# Patient Record
Sex: Female | Born: 1980 | Race: Black or African American | Hispanic: No | Marital: Married | Smoking: Never smoker
Health system: Southern US, Community
[De-identification: ages and names within clinical notes are randomized; demographics above are authoritative.]

## PROBLEM LIST (undated history)

## (undated) DIAGNOSIS — J45909 Unspecified asthma, uncomplicated: Secondary | ICD-10-CM

## (undated) DIAGNOSIS — R87629 Unspecified abnormal cytological findings in specimens from vagina: Secondary | ICD-10-CM

## (undated) HISTORY — DX: Unspecified abnormal cytological findings in specimens from vagina: R87.629

## (undated) HISTORY — DX: Unspecified asthma, uncomplicated: J45.909

---

## 2015-04-02 DIAGNOSIS — J4520 Mild intermittent asthma, uncomplicated: Secondary | ICD-10-CM | POA: Insufficient documentation

## 2015-06-30 DIAGNOSIS — E669 Obesity, unspecified: Secondary | ICD-10-CM | POA: Insufficient documentation

## 2015-09-10 ENCOUNTER — Other Ambulatory Visit (INDEPENDENT_AMBULATORY_CARE_PROVIDER_SITE_OTHER): Payer: BLUE CROSS/BLUE SHIELD

## 2015-09-10 ENCOUNTER — Ambulatory Visit (INDEPENDENT_AMBULATORY_CARE_PROVIDER_SITE_OTHER): Payer: BLUE CROSS/BLUE SHIELD | Admitting: Obstetrics and Gynecology

## 2015-09-10 ENCOUNTER — Other Ambulatory Visit: Payer: Self-pay | Admitting: Obstetrics and Gynecology

## 2015-09-10 VITALS — BP 114/67 | HR 76 | Ht 63.0 in | Wt 192.0 lb

## 2015-09-10 DIAGNOSIS — O09291 Supervision of pregnancy with other poor reproductive or obstetric history, first trimester: Secondary | ICD-10-CM

## 2015-09-10 DIAGNOSIS — Z1389 Encounter for screening for other disorder: Secondary | ICD-10-CM

## 2015-09-10 DIAGNOSIS — Z36 Encounter for antenatal screening of mother: Secondary | ICD-10-CM

## 2015-09-10 DIAGNOSIS — Z8759 Personal history of other complications of pregnancy, childbirth and the puerperium: Secondary | ICD-10-CM

## 2015-09-10 DIAGNOSIS — Z113 Encounter for screening for infections with a predominantly sexual mode of transmission: Secondary | ICD-10-CM

## 2015-09-10 DIAGNOSIS — O3680X0 Pregnancy with inconclusive fetal viability, not applicable or unspecified: Secondary | ICD-10-CM

## 2015-09-10 DIAGNOSIS — R638 Other symptoms and signs concerning food and fluid intake: Secondary | ICD-10-CM

## 2015-09-10 DIAGNOSIS — Z3491 Encounter for supervision of normal pregnancy, unspecified, first trimester: Secondary | ICD-10-CM

## 2015-09-10 DIAGNOSIS — Z369 Encounter for antenatal screening, unspecified: Secondary | ICD-10-CM

## 2015-09-10 DIAGNOSIS — R899 Unspecified abnormal finding in specimens from other organs, systems and tissues: Secondary | ICD-10-CM | POA: Insufficient documentation

## 2015-09-10 DIAGNOSIS — Z3490 Encounter for supervision of normal pregnancy, unspecified, unspecified trimester: Secondary | ICD-10-CM

## 2015-09-10 NOTE — Progress Notes (Signed)
Whitney Velasquez presents for NOB nurse interview visit. G-5.  [id number]. [facility]cy [facility].  Pt. Had miscarriage in 04/2015, SAB.  Has 3 children with one pregnancy being a twin.  Pregnancy education material explained and given. No cats in the home. NOB labs ordered. TSH/HbgA1c due to Increased BMI. HIV labs and Drug screen were explained optional and she could opt out of tests but did not decline. Drug screen ordered. PNV encouraged. To discuss genetic testing with provider.  Ultrasound done for viability and dating for AMA, recent miscarriage. Pt denies any vaginal bleeding (small amt brownish discharge x1) or pain. Pt. To follow up with provider in 4 weeks for NOB physical.  Pt encouraged to contact office with any problems or concerns. All questions answered.  Reviewed. Prentice DockerMartin A Nyella Eckels, MD   Labs: A+/antibody screen negative/HIV nonreactive/l on insulin/rubella immune/RPR pending Pap (05/07/2015): Negative/negative Hemoglobin A1c: 5.1 TSH is 0.831 Urine drug screen: Pending

## 2015-09-10 NOTE — Patient Instructions (Signed)
Pregnancy and Zika Virus Disease Zika virus disease, or Zika, is an illness that can spread to people from mosquitoes that carry the virus. It may also spread from person to person through infected body fluids. Zika first occurred in [place], but recently it has spread to new areas. The virus occurs in tropical climates. The location of Zika continues to change. Most people who become infected with Zika virus do not develop serious illness. However, Zika may cause birth defects in an unborn baby whose mother is infected with the virus. It may also increase the risk of miscarriage. WHAT ARE THE SYMPTOMS OF ZIKA VIRUS DISEASE? In many cases, people who have been infected with Zika virus do not develop any symptoms. If symptoms appear, they usually start about a week after the person is infected. Symptoms are usually mild. They may include:  Fever.  Rash.  Red eyes.  Joint pain. HOW DOES ZIKA VIRUS DISEASE SPREAD? The main way that Zika virus spreads is through the bite of a certain type of mosquito. Unlike most types of mosquitos, which bite only at night, the type of mosquito that carries Zika virus bites both at night and during the [place]. Zika virus can also spread through sexual contact, through a blood transfusion, and from a mother to her baby before or during birth. Once you have had Zika virus disease, it is unlikely that you will get it again. CAN I PASS ZIKA TO MY BABY DURING PREGNANCY? Yes, Zika can pass from a mother to her baby before or during birth. WHAT PROBLEMS CAN ZIKA CAUSE FOR MY BABY? A woman who is infected with Zika virus while pregnant is at risk of having her baby born with a condition in which the brain or head is smaller than expected (microcephaly). Babies who have microcephaly can have developmental delays, seizures, hearing problems, and vision problems. Having Zika virus disease during pregnancy can also increase the risk of miscarriage. HOW CAN ZIKA VIRUS DISEASE BE  PREVENTED? There is no vaccine to prevent Zika. The best way to prevent the disease is to avoid infected mosquitoes and avoid exposure to body fluids that can spread the virus. Avoid any possible exposure to Zika by taking the following precautions. For women and their sex partners:  Avoid traveling to high-risk areas. The locations where Zika is being reported change often. To identify high-risk areas, check the CDC travel website: www.cdc.gov/zika/geo/index.html  If you or your sex partner must travel to a high-risk area, talk with a health care provider before and after traveling.  Take all precautions to avoid mosquito bites if you live in, or travel to, any of the high-risk areas. Insect repellents are safe to use during pregnancy.  Ask your health care provider when it is safe to have sexual contact. For women:  If you are pregnant or trying to become pregnant, avoid sexual contact with persons who may have been exposed to Zika virus, persons who have possible symptoms of Zika, or persons whose history you are unsure about. If you choose to have sexual contact with someone who may have been exposed to Zika virus, use condoms correctly during the entire duration of sexual activity, every time. Do not share sexual devices, as you may be exposed to body fluids.  Ask your health care provider about when it is safe to attempt pregnancy after a possible exposure to Zika virus. WHAT STEPS SHOULD I TAKE TO AVOID MOSQUITO BITES? Take these steps to avoid mosquito bites when you are   in a high-risk area:  Wear loose clothing that covers your arms and legs.  Limit your outdoor activities.  Do not open windows unless they have window screens.  Sleep under mosquito nets.  Use insect repellent. The best insect repellents have:  DEET, picaridin, oil of lemon eucalyptus (OLE), or [id number] in them.  Higher amounts of an active ingredient in them.  Remember that insect repellents are safe to use  during pregnancy.  Do not use OLE on children who are younger than 3 years of age. Do not use insect repellent on babies who are younger than 2 months of age.  Cover your child's stroller with mosquito netting. Make sure the netting fits snugly and that any loose netting does not cover your child's mouth or nose. Do not use a blanket as a mosquito-protection cover.  Do not apply insect repellent underneath clothing.  If you are using sunscreen, apply the sunscreen before applying the insect repellent.  Treat clothing with permethrin. Do not apply permethrin directly to your skin. Follow label directions for safe use.  Get rid of standing water, where mosquitoes may reproduce. Standing water is often found in items such as buckets, bowls, animal food dishes, and flowerpots. When you return from traveling to any high-risk area, continue taking actions to protect yourself against mosquito bites for 3 weeks, even if you show no signs of illness. This will prevent spreading Zika virus to uninfected mosquitoes. WHAT SHOULD I KNOW ABOUT THE SEXUAL TRANSMISSION OF ZIKA? People can spread Zika to their sexual partners during vaginal, anal, or oral sex, or by sharing sexual devices. Many people with Zika do not develop symptoms, so a person could spread the disease without knowing that they are infected. The greatest risk is to women who are pregnant or who may become pregnant. Zika virus can live longer in semen than it can live in blood. Couples can prevent sexual transmission of the virus by:  Using condoms correctly during the entire duration of sexual activity, every time. This includes vaginal, anal, and oral sex.  Not sharing sexual devices. Sharing increases your risk of being exposed to body fluid from another person.  Avoiding all sexual activity until your health care provider says it is safe. SHOULD I BE TESTED FOR ZIKA VIRUS? A sample of your blood can be tested for Zika virus. A pregnant  woman should be tested if she may have been exposed to the virus or if she has symptoms of Zika. She may also have additional tests done during her pregnancy, such ultrasound testing. Talk with your health care provider about which tests are recommended.   This information is not intended to replace advice given to you by your health care provider. Make sure you discuss any questions you have with your health care provider.   Document Released: 11/05/2014 Document Reviewed: 10/29/2014 Elsevier Interactive Patient Education 2016 [employer]vier [employer] Minor Illnesses and Medications in Pregnancy  Cold/Flu:  Sudafed for congestion- Robitussin (plain) for cough- Tylenol for discomfort.  Please follow the directions on the label.  Try not to take any more than needed.  OTC Saline nasal spray and air humidifier or cool-mist  Vaporizer to sooth nasal irritation and to loosen congestion.  It is also important to increase intake of non carbonated fluids, especially if you have a fever.  Constipation:  Colace-2 capsules at bedtime; Metamucil- follow directions on label; Senokot- 1 tablet at bedtime.  Any one of these medications can be used.  It is also   very important to increase fluids and fruits along with regular exercise.  If problem persists please call the office.  Diarrhea:  Kaopectate as directed on the label.  Eat a bland diet and increase fluids.  Avoid highly seasoned foods.  Headache:  Tylenol 1 or 2 tablets every 3-4 hours as needed  Indigestion:  Maalox, Mylanta, Tums or Rolaids- as directed on label.  Also try to eat small meals and avoid fatty, greasy or spicy foods.  Nausea with or without Vomiting:  Nausea in pregnancy is caused by increased levels of hormones in the body which influence the digestive system and cause irritation when stomach acids accumulate.  Symptoms usually subside after 1st trimester of pregnancy.  Try the following:  Keep saltines, graham crackers or dry toast by your bed  to eat upon awakening.  Don't let your stomach get empty.  Try to eat 5-6 small meals per Szuch instead of 3 large ones.  Avoid greasy fatty or highly seasoned foods.   Take OTC Unisom 1 tablet at bed time along with OTC Vitamin B6 25-50 mg 3 times per Tompson.    If nausea continues with vomiting and you are unable to keep down food and fluids you may need a prescription medication.  Please notify your provider.   Sore throat:  Chloraseptic spray, throat lozenges and or plain Tylenol.  Vaginal Yeast Infection:  OTC Monistat for 7 days as directed on label.  If symptoms do not resolve within a week notify provider.  If any of the above problems do not subside with recommended treatment please call the office for further assistance.   Do not take Aspirin, Advil, Motrin or Ibuprofen.  * * OTC= Over the counter Hyperemesis Gravidarum Hyperemesis gravidarum is a severe form of nausea and vomiting that happens during pregnancy. Hyperemesis is worse than morning sickness. It may cause you to have nausea or vomiting all Williamson for many days. It may keep you from eating and drinking enough food and liquids. Hyperemesis usually occurs during the first half (the first 20 weeks) of pregnancy. It often goes away once a woman is in her second half of pregnancy. However, sometimes hyperemesis continues through an entire pregnancy.  CAUSES  The cause of this condition is not completely known but is thought to be related to changes in the body's hormones when pregnant. It could be from the high level of the pregnancy hormone or an increase in estrogen in the body.  SIGNS AND SYMPTOMS   Severe nausea and vomiting.  Nausea that does not go away.  Vomiting that does not allow you to keep any food down.  Weight loss and body fluid loss (dehydration).  Having no desire to eat or not liking food you have previously enjoyed. DIAGNOSIS  Your health care provider will do a physical exam and ask you about your symptoms.  He or she may also order blood tests and urine tests to make sure something else is not causing the problem.  TREATMENT  You may only need medicine to control the problem. If medicines do not control the nausea and vomiting, you will be treated in the hospital to prevent dehydration, increased acid in the blood (acidosis), weight loss, and changes in the electrolytes in your body that may harm the unborn baby (fetus). You may need IV fluids.  HOME CARE INSTRUCTIONS   Only take over-the-counter or prescription medicines as directed by your health care provider.  Try eating a couple of dry crackers or   toast in the morning before getting out of bed.  Avoid foods and smells that upset your stomach.  Avoid fatty and spicy foods.  Eat 5-6 small meals a [place]yer.  Do not drink when eating meals. Drink between meals.  For snacks, eat high-protein foods, such as cheese.  Eat or suck on things that have ginger in them. Ginger helps nausea.  Avoid food preparation. The smell of food can spoil your appetite.  Avoid iron pills and iron in your multivitamins until after 3-4 months of being pregnant. However, consult with your health care provider before stopping any prescribed iron pills. SEEK MEDICAL CARE IF:   Your abdominal pain increases.  You have a severe headache.  You have vision problems.  You are losing weight. SEEK IMMEDIATE MEDICAL CARE IF:   You are unable to keep fluids down.  You vomit blood.  You have constant nausea and vomiting.  You have excessive weakness.  You have extreme thirst.  You have dizziness or fainting.  You have a fever or persistent symptoms for more than 2-3 days.  You have a fever and your symptoms suddenly get worse. MAKE SURE YOU:   Understand these instructions.  Will watch your condition.  Will get help right away if you are not doing well or get worse.   This information is not intended to replace advice given to you by your health care  provider. Make sure you discuss any questions you have with your health care provider.   Document Released: 02/14/2005 Document Revised: 12/05/2012 Document Reviewed: 09/26/2012 Elsevier Interactive Patient Education 2016 Elsevier Inc. Commonly Asked Questions During Pregnancy  Cats: A parasite can be excreted in cat feces.  To avoid exposure you need to have another person empty the little box.  If you must empty the litter box you will need to wear gloves.  Wash your hands after handling your cat.  This parasite can also be found in raw or undercooked meat so this should also be avoided.  Colds, Sore Throats, Flu: Please check your medication sheet to see what you can take for symptoms.  If your symptoms are unrelieved by these medications please call the office.  Dental Work: Most any dental work your dentist recommends is permitted.  X-rays should only be taken during the first trimester if absolutely necessary.  Your abdomen should be shielded with a lead apron during all x-rays.  Please notify your provider prior to receiving any x-rays.  Novocaine is fine; gas is not recommended.  If your dentist requires a note from us prior to dental work please call the office and we will provide one for you.  Exercise: Exercise is an important part of staying healthy during your pregnancy.  You may continue most exercises you were accustomed to prior to pregnancy.  Later in your pregnancy you will most likely notice you have difficulty with activities requiring balance like riding a bicycle.  It is important that you listen to your body and avoid activities that put you at a higher risk of falling.  Adequate rest and staying well hydrated are a must!  If you have questions about the safety of specific activities ask your provider.    Exposure to Children with illness: Try to avoid obvious exposure; report any symptoms to us when noted,  If you have chicken pos, red measles or mumps, you should be immune to  these diseases.   Please do not take any vaccines while pregnant unless you have checked with   your OB provider.  Fetal Movement: After 28 weeks we recommend you do "kick counts" twice daily.  Lie or sit down in a calm quiet environment and count your baby movements "kicks".  You should feel your baby at least 10 times per hour.  If you have not felt 10 kicks within the first hour get up, walk around and have something sweet to eat or drink then repeat for an additional hour.  If count remains less than 10 per hour notify your provider.  Fumigating: Follow your pest control agent's advice as to how long to stay out of your home.  Ventilate the area well before re-entering.  Hemorrhoids:   Most over-the-counter preparations can be used during pregnancy.  Check your medication to see what is safe to use.  It is important to use a stool softener or fiber in your diet and to drink lots of liquids.  If hemorrhoids seem to be getting worse please call the office.   Hot Tubs:  Hot tubs Jacuzzis and saunas are not recommended while pregnant.  These increase your internal body temperature and should be avoided.  Intercourse:  Sexual intercourse is safe during pregnancy as long as you are comfortable, unless otherwise advised by your provider.  Spotting may occur after intercourse; report any bright red bleeding that is heavier than spotting.  Labor:  If you know that you are in labor, please go to the hospital.  If you are unsure, please call the office and let us help you decide what to do.  Lifting, straining, etc:  If your job requires heavy lifting or straining please check with your provider for any limitations.  Generally, you should not lift items heavier than that you can lift simply with your hands and arms (no back muscles)  Painting:  Paint fumes do not harm your pregnancy, but may make you ill and should be avoided if possible.  Latex or water based paints have less odor than oils.  Use adequate  ventilation while painting.  Permanents & Hair Color:  Chemicals in hair dyes are not recommended as they cause increase hair dryness which can increase hair loss during pregnancy.  " Highlighting" and permanents are allowed.  Dye may be absorbed differently and permanents may not hold as well during pregnancy.  Sunbathing:  Use a sunscreen, as skin burns easily during pregnancy.  Drink plenty of fluids; avoid over heating.  Tanning Beds:  Because their possible side effects are still unknown, tanning beds are not recommended.  Ultrasound Scans:  Routine ultrasounds are performed at approximately 20 weeks.  You will be able to see your baby's general anatomy an if you would like to know the gender this can usually be determined as well.  If it is questionable when you conceived you may also receive an ultrasound early in your pregnancy for dating purposes.  Otherwise ultrasound exams are not routinely performed unless there is a medical necessity.  Although you can request a scan we ask that you pay for it when conducted because insurance does not cover " patient request" scans.  Work: If your pregnancy proceeds without complications you may work until your due date, unless your physician or employer advises otherwise.  Round Ligament Pain/Pelvic Discomfort:  Sharp, shooting pains not associated with bleeding are fairly common, usually occurring in the second trimester of pregnancy.  They tend to be worse when standing up or when you remain standing for long periods of time.  These are the result   of pressure of certain pelvic ligaments called "round ligaments".  Rest, Tylenol and heat seem to be the most effective relief.  As the womb and fetus grow, they rise out of the pelvis and the discomfort improves.  Please notify the office if your pain seems different than that described.  It may represent a more serious condition.   

## 2015-09-12 LAB — URINALYSIS, ROUTINE W REFLEX MICROSCOPIC
Bilirubin, UA: NEGATIVE
Glucose, UA: NEGATIVE
Ketones, UA: NEGATIVE
Leukocytes, UA: NEGATIVE
NITRITE UA: NEGATIVE
PH UA: 6 (ref 5.0–7.5)
Protein, UA: NEGATIVE
RBC, UA: NEGATIVE
Specific Gravity, UA: 1.023 (ref 1.005–1.030)
UUROB: 0.2 mg/dL (ref 0.2–1.0)

## 2015-09-12 LAB — MONITOR DRUG PROFILE 14(MW)
Amphetamine Scrn, Ur: NEGATIVE ng/mL
BARBITURATE SCREEN URINE: NEGATIVE ng/mL
BENZODIAZEPINE SCREEN, URINE: NEGATIVE ng/mL
BUPRENORPHINE, URINE: NEGATIVE ng/mL
CANNABINOIDS UR QL SCN: NEGATIVE ng/mL
COCAINE(METAB.)SCREEN, URINE: NEGATIVE ng/mL
CREATININE(CRT), U: 119.4 mg/dL (ref 20.0–300.0)
Fentanyl, Urine: NEGATIVE pg/mL
MEPERIDINE SCREEN, URINE: NEGATIVE ng/mL
METHADONE SCREEN, URINE: NEGATIVE ng/mL
OPIATE SCREEN URINE: NEGATIVE ng/mL
OXYCODONE+OXYMORPHONE UR QL SCN: NEGATIVE ng/mL
PHENCYCLIDINE QUANTITATIVE URINE: NEGATIVE ng/mL
Ph of Urine: 5.8 (ref 4.5–8.9)
Propoxyphene Scrn, Ur: NEGATIVE ng/mL
SPECIFIC GRAVITY: 1.026
Tramadol Screen, Urine: NEGATIVE ng/mL

## 2015-09-12 LAB — CBC WITH DIFFERENTIAL/PLATELET
Basophils Absolute: 0.1 10*3/uL (ref 0.0–0.2)
Basos: 1 %
EOS (ABSOLUTE): 0.3 10*3/uL (ref 0.0–0.4)
EOS: 3 %
HEMATOCRIT: 40.9 % (ref 34.0–46.6)
Hemoglobin: 13.7 g/dL (ref 11.1–15.9)
IMMATURE GRANULOCYTES: 0 %
Immature Grans (Abs): 0 10*3/uL (ref 0.0–0.1)
LYMPHS: 26 %
Lymphocytes Absolute: 2.7 10*3/uL (ref 0.7–3.1)
MCH: 28.6 pg (ref 26.6–33.0)
MCHC: 33.5 g/dL (ref 31.5–35.7)
MCV: 85 fL (ref 79–97)
MONOCYTES: 6 %
MONOS ABS: 0.6 10*3/uL (ref 0.1–0.9)
NEUTROS PCT: 64 %
Neutrophils Absolute: 6.5 10*3/uL (ref 1.4–7.0)
Platelets: 330 10*3/uL (ref 150–379)
RBC: 4.79 x10E6/uL (ref 3.77–5.28)
RDW: 13.6 % (ref 12.3–15.4)
WBC: 10.1 10*3/uL (ref 3.4–10.8)

## 2015-09-12 LAB — VARICELLA ZOSTER ANTIBODY, IGG: VARICELLA: 1841 {index} (ref >165)

## 2015-09-12 LAB — HEMOGLOBIN A1C
ESTIMATED AVERAGE GLUCOSE: 100 mg/dL
Hgb A1c MFr Bld: 5.1 % (ref 4.8–5.6)

## 2015-09-12 LAB — GC/CHLAMYDIA PROBE AMP
Chlamydia trachomatis, NAA: NEGATIVE
NEISSERIA GONORRHOEAE BY PCR: NEGATIVE

## 2015-09-12 LAB — RUBELLA SCREEN: Rubella Antibodies, IGG: 8.88 index (ref >0.99)

## 2015-09-12 LAB — NICOTINE SCREEN, URINE: Cotinine Ql Scrn, Ur: NEGATIVE ng/mL

## 2015-09-12 LAB — TSH: TSH: 0.831 u[IU]/mL (ref 0.450–4.500)

## 2015-09-12 LAB — ANTIBODY SCREEN: Antibody Screen: NEGATIVE

## 2015-09-12 LAB — HIV ANTIBODY (ROUTINE TESTING W REFLEX): HIV SCREEN 4TH GENERATION: NONREACTIVE

## 2015-09-12 LAB — HEPATITIS B SURFACE ANTIGEN: Hepatitis B Surface Ag: NEGATIVE

## 2015-09-12 LAB — RPR: RPR Ser Ql: NONREACTIVE

## 2015-09-14 ENCOUNTER — Encounter: Payer: Self-pay | Admitting: Internal Medicine

## 2015-09-15 LAB — URINE CULTURE, OB REFLEX

## 2015-09-15 LAB — CULTURE, OB URINE

## 2015-10-07 ENCOUNTER — Encounter: Payer: BLUE CROSS/BLUE SHIELD | Admitting: Obstetrics and Gynecology

## 2015-10-14 ENCOUNTER — Other Ambulatory Visit (INDEPENDENT_AMBULATORY_CARE_PROVIDER_SITE_OTHER): Payer: BLUE CROSS/BLUE SHIELD

## 2015-10-14 ENCOUNTER — Other Ambulatory Visit: Payer: Self-pay | Admitting: Obstetrics and Gynecology

## 2015-10-14 ENCOUNTER — Ambulatory Visit (INDEPENDENT_AMBULATORY_CARE_PROVIDER_SITE_OTHER): Payer: BLUE CROSS/BLUE SHIELD | Admitting: Obstetrics and Gynecology

## 2015-10-14 ENCOUNTER — Encounter: Payer: Self-pay | Admitting: Obstetrics and Gynecology

## 2015-10-14 VITALS — BP 113/74 | HR 80 | Wt 196.9 lb

## 2015-10-14 DIAGNOSIS — E669 Obesity, unspecified: Secondary | ICD-10-CM

## 2015-10-14 DIAGNOSIS — J4520 Mild intermittent asthma, uncomplicated: Secondary | ICD-10-CM

## 2015-10-14 DIAGNOSIS — O09291 Supervision of pregnancy with other poor reproductive or obstetric history, first trimester: Secondary | ICD-10-CM

## 2015-10-14 DIAGNOSIS — O3680X0 Pregnancy with inconclusive fetal viability, not applicable or unspecified: Secondary | ICD-10-CM

## 2015-10-14 DIAGNOSIS — Z8742 Personal history of other diseases of the female genital tract: Secondary | ICD-10-CM

## 2015-10-14 DIAGNOSIS — O09521 Supervision of elderly multigravida, first trimester: Secondary | ICD-10-CM

## 2015-10-14 DIAGNOSIS — Z98891 History of uterine scar from previous surgery: Secondary | ICD-10-CM

## 2015-10-14 LAB — POCT URINALYSIS DIPSTICK
BILIRUBIN UA: NEGATIVE
Blood, UA: NEGATIVE
Glucose, UA: NEGATIVE
KETONES UA: NEGATIVE
LEUKOCYTES UA: NEGATIVE
Nitrite, UA: NEGATIVE
PH UA: 6
Protein, UA: NEGATIVE
SPEC GRAV UA: 1.02
Urobilinogen, UA: 0.2

## 2015-10-14 NOTE — Progress Notes (Signed)
OBSTETRIC INITIAL PRENATAL VISIT  Subjective:    Whitney Velasquez is being seen today for her first obstetrical visit.  This is not a planned pregnancy. She is a [id number] female at 1951w4d gestation, Estimated Date of Delivery: 04/30/16 with Patient's last menstrual period was 07/25/2015 (approximate). (consistent with 6 week sono). Her obstetrical history is significant for advanced maternal age, obesity and prior C-section x 1, h/o asthma. Relationship with FOB: spouse, living together. Patient does intend to breast feed. Pregnancy history fully reviewed.    Obstetric History   G5   P2   T2   P0   A2   L3    SAB0   TAB0   Ectopic0   Multiple1   Live Births3     # Outcome Date GA Lbr Len/2nd Weight Sex Delivery Anes PTL Lv  5 Current           4 SAB 04/2015        FD  3A Term 2004 8515w0d  4 lb 9 oz (2.07 kg) F Vag-Spont  N LIV  3B Term 2004 315w0d  4 lb 4 oz (1.928 kg) M Vag-Spont  N LIV  2 Term 2000 3842w0d  8 lb 2.6 oz (3.702 kg) M CS-Unspec  N LIV     Complications: Failure to Progress in First Stage  1 AB 1998        FD    Obstetric Comments  2000-failure to progress  2004 twins-female and female    Gynecologic History:  Last pap smear was 04/2015.  Results were normal.  Admits h/o abnormal pap smear x 1 in 2016, no colposcopy performed.   Denies history of STIs.    Past Medical History:  Diagnosis Date  . Asthma    Last asthma exacerbation requiring hospitalization was in 2013, no intubations.  . Vaginal Pap smear, abnormal    was scheduled for colpo but found out she was pregnant     Family History  Problem Relation Age of Onset  . Fibroids Mother   . Diabetes Maternal Grandmother   . Heart disease Maternal Grandmother     CAD  . Cancer Paternal Grandfather   . Rashes / Skin problems Son     eczema  . Rashes / Skin problems Daughter     eczema     Past Surgical History:  Procedure Laterality Date  . CESAREAN SECTION       Social History   Social History  .  Marital status: Married    Spouse name: N/A  . Number of children: N/A  . Years of education: N/A   Occupational History  . fiancial     [employer]   Social History Main Topics  . Smoking status: Never Smoker  . Smokeless tobacco: Never Used  . Alcohol use 0.0 oz/week  . Drug use: No  . Sexual activity: Yes    Partners: Male   Other Topics Concern  . Not on file   Social History Narrative  . No narrative on file     Current Outpatient Prescriptions on File Prior to Visit  Medication Sig Dispense Refill  . albuterol (ACCUNEB) 1.25 MG/3ML nebulizer solution Inhale into the lungs.    Marland Kitchen. albuterol (PROAIR HFA) 108 (90 Base) MCG/ACT inhaler Inhale into the lungs.    . Fluticasone-Salmeterol (ADVAIR DISKUS) 250-50 MCG/DOSE AEPB Inhale 1 Inhaler into the lungs every 12 (twelve) hours. Inhale 1 inhalation into the lungs every 12 (twelve) hours.    .Marland Kitchen  Prenatal Vit-Fe Fumarate-FA (PRENATAL VITAMINS PLUS PO) Take 1 tablet by mouth daily.     No current facility-administered medications on file prior to visit.     Allergies  Allergen Reactions  . Penicillin G Other (See Comments)    unknown    Review of Systems General:Not Present- Fever, Weight Loss and Weight Gain. Skin:Not Present- Rash. HEENT:Not Present- Blurred Vision, Headache and Bleeding Gums. Respiratory:Not Present- Difficulty Breathing. Breast:Not Present- Breast Mass. Cardiovascular:Not Present- Chest Pain, Elevated Blood Pressure, Fainting / Blacking Out and Shortness of Breath. Gastrointestinal:Not Present- Abdominal Pain, Constipation, Nausea and Vomiting. Female Genitourinary:Not Present- Frequency, Painful Urination, Pelvic Pain, Vaginal Bleeding, Vaginal Discharge, Contractions, regular, Fetal Movements Decreased, Urinary Complaints and Vaginal Fluid. Musculoskeletal:Not Present- Back Pain and Leg Cramps. Neurological:Not Present- Dizziness. Psychiatric:Not Present- Depression.       Objective:   Blood pressure 113/74, pulse 80, weight 196 lb 14.4 oz (89.3 kg), last menstrual period 07/25/2015.  Body mass index is 34.88 kg/m.   General Appearance:    Alert, cooperative, no distress, appears stated age  Head:    Normocephalic, without obvious abnormality, atraumatic  Eyes:    PERRL, conjunctiva/corneas clear, EOM's intact, both eyes  Ears:    Normal external ear canals, both ears  Nose:   Nares normal, septum midline, mucosa normal, no drainage or sinus tenderness  Throat:   Lips, mucosa, and tongue normal; teeth and gums normal  Neck:   Supple, symmetrical, trachea midline, no adenopathy; thyroid: no enlargement/tenderness/nodules; no carotid bruit or JVD  Back:     Symmetric, no curvature, ROM normal, no CVA tenderness  Lungs:     Clear to auscultation bilaterally, respirations unlabored  Chest Wall:    No tenderness or deformity   Heart:    Regular rate and rhythm, S1 and S2 normal, no murmur, rub or gallop  Breast Exam:    No tenderness, masses, or nipple abnormality  Abdomen:     Soft, non-tender, bowel sounds active all four quadrants, no masses, no organomegaly.  FHT 164 bpm.  Genitalia:    Pelvic:external genitalia normal, vagina without lesions, discharge, or tenderness, rectovaginal septum  normal. Cervix normal in appearance, no cervical motion tenderness, no adnexal masses or tenderness.  Pregnancy positive findings: uterine enlargement: 12 wk size, nontender.   Rectal:    Normal external sphincter.  No hemorrhoids appreciated. Internal exam not done.   Extremities:   Extremities normal, atraumatic, no cyanosis or edema  Pulses:   2+ and symmetric all extremities  Skin:   Skin color, texture, turgor normal, no rashes or lesions  Lymph nodes:   Cervical, supraclavicular, and axillary nodes normal  Neurologic:   CNII-XII intact, normal strength, sensation and reflexes throughout    Assessment:   Pregnancy at 11 and 4/7 weeks   H/o asthma Obesity (Class  I) H/o prior C-section x 1 H/o recent miscarriage H/o abnormal pap smear Advanced maternal age   Plan:   1. Pregnancy at 11 weeks - Initial labs reviewed. - Prenatal vitamins encouraged. - Problem list reviewed and updated. - New OB counseling:  The patient has been given an overview regarding routine prenatal care.   - Prenatal testing, optional genetic testing, and ultrasound use in pregnancy were reviewed.  AFP3 discussed: declined. - Benefits of Breast Feeding were discussed. The patient is encouraged to consider nursing her baby post partum. Follow up in 4 weeks.  2.  Asthma  - Last saw PCP (Dtr. Sparks) in July for management of asthma.  -  Currently using Albuterol inhaler and ProAir as needed.  Patient is supposed to be using Advair as well, however notes that she uses it prn. Advised on use of medications as prescribed.   3. Obesity  - Patient will need early glucola.  - Recommendations regarding diet, weight gain, and exercise in pregnancy were given.  4. H/o prior C-section x 1, with successful VBAC of twins.  - Discussion had on repeat TOLAC vs repeat C-section.  Patient would like to have another TOLAC.   5. H/o recent miscarriage - Heart tones not noted on Dopplers today, but limited sono notes FHT at 164 bpm.  Given precautions.   6. H/o abnormal pap smear - Pap smear in 2016 abnormal (did not have colposcopy, repeat in 2017 normal).  Will continue yearly pap smears until pap smears normal x 3.   7. Advanced maternal age - Declined genetic testing.  Will need detailed anatomy scan at 20 weeks.   RTC in 4 weeks.   50% of 30 min visit spent on counseling and coordination of care.     Hildred LaserAnika Vinod Mikesell, MD [facility]

## 2015-10-17 ENCOUNTER — Encounter: Payer: Self-pay | Admitting: Obstetrics and Gynecology

## 2015-10-18 DIAGNOSIS — Z98891 History of uterine scar from previous surgery: Secondary | ICD-10-CM | POA: Insufficient documentation

## 2015-10-18 DIAGNOSIS — O09299 Supervision of pregnancy with other poor reproductive or obstetric history, unspecified trimester: Secondary | ICD-10-CM | POA: Insufficient documentation

## 2015-10-18 DIAGNOSIS — Z8742 Personal history of other diseases of the female genital tract: Secondary | ICD-10-CM | POA: Insufficient documentation

## 2015-10-18 DIAGNOSIS — O09523 Supervision of elderly multigravida, third trimester: Secondary | ICD-10-CM | POA: Insufficient documentation

## 2015-11-18 ENCOUNTER — Ambulatory Visit (INDEPENDENT_AMBULATORY_CARE_PROVIDER_SITE_OTHER): Payer: BLUE CROSS/BLUE SHIELD | Admitting: Obstetrics and Gynecology

## 2015-11-18 VITALS — BP 106/66 | HR 80 | Wt 203.4 lb

## 2015-11-18 DIAGNOSIS — E669 Obesity, unspecified: Secondary | ICD-10-CM

## 2015-11-18 DIAGNOSIS — O09522 Supervision of elderly multigravida, second trimester: Secondary | ICD-10-CM

## 2015-11-18 DIAGNOSIS — R0981 Nasal congestion: Secondary | ICD-10-CM

## 2015-11-18 LAB — POCT URINALYSIS DIPSTICK
BILIRUBIN UA: NEGATIVE
GLUCOSE UA: NEGATIVE
KETONES UA: NEGATIVE
Nitrite, UA: NEGATIVE
Protein, UA: NEGATIVE
RBC UA: NEGATIVE
Urobilinogen, UA: NEGATIVE
pH, UA: 6.5

## 2015-11-18 NOTE — Progress Notes (Signed)
ROB: Doing well, no major complaints. Taking Sudafed for congestion. Notes occasional mild cramping, taking Tylenol.  Denies bleeding. For anatomy scan next visit.  Unsure about flu vaccine, to discuss again next visit. RTC in 4 weeks. For early glucola by next visit.

## 2015-12-16 ENCOUNTER — Ambulatory Visit (INDEPENDENT_AMBULATORY_CARE_PROVIDER_SITE_OTHER): Payer: BLUE CROSS/BLUE SHIELD | Admitting: Obstetrics and Gynecology

## 2015-12-16 ENCOUNTER — Ambulatory Visit (INDEPENDENT_AMBULATORY_CARE_PROVIDER_SITE_OTHER): Payer: BLUE CROSS/BLUE SHIELD

## 2015-12-16 ENCOUNTER — Other Ambulatory Visit: Payer: Self-pay

## 2015-12-16 ENCOUNTER — Other Ambulatory Visit: Payer: BLUE CROSS/BLUE SHIELD

## 2015-12-16 VITALS — BP 102/67 | HR 86 | Wt 205.3 lb

## 2015-12-16 DIAGNOSIS — Z131 Encounter for screening for diabetes mellitus: Secondary | ICD-10-CM

## 2015-12-16 DIAGNOSIS — O99212 Obesity complicating pregnancy, second trimester: Secondary | ICD-10-CM

## 2015-12-16 DIAGNOSIS — O09522 Supervision of elderly multigravida, second trimester: Secondary | ICD-10-CM

## 2015-12-16 DIAGNOSIS — Z6836 Body mass index (BMI) 36.0-36.9, adult: Secondary | ICD-10-CM

## 2015-12-16 DIAGNOSIS — E6609 Other obesity due to excess calories: Secondary | ICD-10-CM

## 2015-12-16 LAB — POCT URINALYSIS DIPSTICK
Bilirubin, UA: NEGATIVE
Glucose, UA: NEGATIVE
KETONES UA: NEGATIVE
Leukocytes, UA: NEGATIVE
Nitrite, UA: NEGATIVE
PH UA: 8
PROTEIN UA: NEGATIVE
RBC UA: NEGATIVE
UROBILINOGEN UA: NEGATIVE

## 2015-12-16 NOTE — Addendum Note (Signed)
Addended by: Lunette StandsSIEMIENSKI, Dexter Sauser J on: 12/16/2015 02:33 PM   Modules accepted: Orders

## 2015-12-16 NOTE — Progress Notes (Signed)
ROB: Doing well, no complaints. Early glucola done today. S/p normal anatomy scan.  Declines flu vaccine.  RTC in 4 weeks.

## 2015-12-17 LAB — GLUCOSE, 1 HOUR GESTATIONAL: GESTATIONAL DIABETES SCREEN: 79 mg/dL (ref 65–139)

## 2016-01-14 ENCOUNTER — Ambulatory Visit (INDEPENDENT_AMBULATORY_CARE_PROVIDER_SITE_OTHER): Payer: BLUE CROSS/BLUE SHIELD | Admitting: Obstetrics and Gynecology

## 2016-01-14 VITALS — BP 110/70 | HR 94 | Wt 209.2 lb

## 2016-01-14 DIAGNOSIS — E6609 Other obesity due to excess calories: Secondary | ICD-10-CM

## 2016-01-14 DIAGNOSIS — Z6836 Body mass index (BMI) 36.0-36.9, adult: Secondary | ICD-10-CM

## 2016-01-14 DIAGNOSIS — R0981 Nasal congestion: Secondary | ICD-10-CM

## 2016-01-14 DIAGNOSIS — O09522 Supervision of elderly multigravida, second trimester: Secondary | ICD-10-CM

## 2016-01-14 LAB — POCT URINALYSIS DIPSTICK
BILIRUBIN UA: NEGATIVE
GLUCOSE UA: NEGATIVE
KETONES UA: NEGATIVE
LEUKOCYTES UA: NEGATIVE
NITRITE UA: NEGATIVE
Protein, UA: NEGATIVE
RBC UA: NEGATIVE
Spec Grav, UA: 1.015
Urobilinogen, UA: NEGATIVE
pH, UA: 6.5

## 2016-01-14 NOTE — Progress Notes (Signed)
ROB: OB: Patient notes stuffy nose, difficulty breathing at night when lying down. Has weaned from Affrin. Advised on nasal saline spray, Nettie pot, Vicks vapor rub under nose at night.  RTC in 4 weeks. Will do glucola at 30 weeks as patient was later in gestation doing early glucola.

## 2016-02-17 ENCOUNTER — Ambulatory Visit (INDEPENDENT_AMBULATORY_CARE_PROVIDER_SITE_OTHER): Payer: BLUE CROSS/BLUE SHIELD | Admitting: Obstetrics and Gynecology

## 2016-02-17 VITALS — BP 104/65 | HR 90 | Wt 213.0 lb

## 2016-02-17 DIAGNOSIS — Z23 Encounter for immunization: Secondary | ICD-10-CM

## 2016-02-17 DIAGNOSIS — Z98891 History of uterine scar from previous surgery: Secondary | ICD-10-CM

## 2016-02-17 DIAGNOSIS — Z6836 Body mass index (BMI) 36.0-36.9, adult: Secondary | ICD-10-CM

## 2016-02-17 DIAGNOSIS — O09523 Supervision of elderly multigravida, third trimester: Secondary | ICD-10-CM

## 2016-02-17 DIAGNOSIS — Z131 Encounter for screening for diabetes mellitus: Secondary | ICD-10-CM

## 2016-02-17 DIAGNOSIS — E6609 Other obesity due to excess calories: Secondary | ICD-10-CM

## 2016-02-17 LAB — POCT URINALYSIS DIPSTICK
BILIRUBIN UA: NEGATIVE
GLUCOSE UA: NEGATIVE
Ketones, UA: NEGATIVE
Leukocytes, UA: NEGATIVE
Nitrite, UA: NEGATIVE
Protein, UA: NEGATIVE
RBC UA: NEGATIVE
SPEC GRAV UA: 1.02
UROBILINOGEN UA: NEGATIVE
pH, UA: 6.5

## 2016-02-17 MED ORDER — TETANUS-DIPHTH-ACELL PERTUSSIS 5-2.5-18.5 LF-MCG/0.5 IM SUSP
0.5000 mL | Freq: Once | INTRAMUSCULAR | Status: AC
  Administered 2016-02-17: 0.5 mL via INTRAMUSCULAR

## 2016-02-17 NOTE — Progress Notes (Signed)
ROB: Doing well, no complaints.  Desires to breastfeed.  Desires NuvaRing for contraception. For Tdap today, signed blood consent, discussed cord blood banking.  Desire delayed cord clamping (until pulsation stops).  For repeat glucola next visit.

## 2016-02-29 NOTE — L&D Delivery Note (Signed)
Delivery Summary for Whitney Velasquez  Labor Events:   Preterm labor:   Rupture date:   Rupture time:   Rupture type:   Fluid Color:   Induction:   Augmentation:   Complications:   Cervical ripening:          Delivery:   Episiotomy:   Lacerations:   Repair suture:   Repair # of packets:   Blood loss (ml): 600   Information for the patient's newborn:  Lemaster, Girl Piedad ClimesVernetta [[id number]][[id number]]    Delivery 04/29/2016 4:31 AM by  C-Section, Low Transverse Sex:  female Gestational Age: 3736w6d Delivery Clinician:   Living?:         APGARS  One minute Five minutes Ten minutes  Skin color:        Heart rate:        Grimace:        Muscle tone:        Breathing:        Totals: 9  9      Presentation/position:      Resuscitation:   Cord information:    Disposition of cord blood:     Blood gases sent?  Complications:   Placenta: Delivered:       appearance Newborn Measurements: Weight: 7 lb 3.7 oz (3280 g)  Height: 19.09"  Head circumference:    Chest circumference:    Other providers:    Additional  information: Forceps:   Vacuum:   Breech:   Observed anomalies       Please see C-section operative note from Dr. Valentino Saxonherry for delivery details.    Hildred LaserAnika Addeline Calarco, MD [facility]

## 2016-03-02 ENCOUNTER — Other Ambulatory Visit: Payer: BLUE CROSS/BLUE SHIELD

## 2016-03-02 ENCOUNTER — Ambulatory Visit (INDEPENDENT_AMBULATORY_CARE_PROVIDER_SITE_OTHER): Payer: BLUE CROSS/BLUE SHIELD | Admitting: Obstetrics and Gynecology

## 2016-03-02 VITALS — BP 122/74 | HR 98 | Wt 215.1 lb

## 2016-03-02 DIAGNOSIS — O09523 Supervision of elderly multigravida, third trimester: Secondary | ICD-10-CM

## 2016-03-02 DIAGNOSIS — E6609 Other obesity due to excess calories: Secondary | ICD-10-CM

## 2016-03-02 DIAGNOSIS — Z6836 Body mass index (BMI) 36.0-36.9, adult: Secondary | ICD-10-CM

## 2016-03-02 DIAGNOSIS — Z98891 History of uterine scar from previous surgery: Secondary | ICD-10-CM

## 2016-03-02 DIAGNOSIS — Z131 Encounter for screening for diabetes mellitus: Secondary | ICD-10-CM

## 2016-03-02 LAB — POCT URINALYSIS DIPSTICK
BILIRUBIN UA: NEGATIVE
Blood, UA: NEGATIVE
Glucose, UA: NEGATIVE
KETONES UA: 40
NITRITE UA: NEGATIVE
PH UA: 6.5
Protein, UA: NEGATIVE
Spec Grav, UA: 1.025
Urobilinogen, UA: NEGATIVE

## 2016-03-02 NOTE — Progress Notes (Signed)
ROB: Doing well, no complaints. For glucola today. RTC in 2 weeks.

## 2016-03-03 LAB — CBC
Hematocrit: 36.3 % (ref 34.0–46.6)
Hemoglobin: 12.3 g/dL (ref 11.1–15.9)
MCH: 27.7 pg (ref 26.6–33.0)
MCHC: 33.9 g/dL (ref 31.5–35.7)
MCV: 82 fL (ref 79–97)
PLATELETS: 288 10*3/uL (ref 150–379)
RBC: 4.44 x10E6/uL (ref 3.77–5.28)
RDW: 13.5 % (ref 12.3–15.4)
WBC: 12.1 10*3/uL — AB (ref 3.4–10.8)

## 2016-03-03 LAB — GLUCOSE, 1 HOUR GESTATIONAL: GESTATIONAL DIABETES SCREEN: 101 mg/dL (ref 65–139)

## 2016-03-04 ENCOUNTER — Encounter: Payer: Self-pay | Admitting: Obstetrics and Gynecology

## 2016-03-07 ENCOUNTER — Encounter: Payer: Self-pay | Admitting: Obstetrics and Gynecology

## 2016-03-15 ENCOUNTER — Ambulatory Visit (INDEPENDENT_AMBULATORY_CARE_PROVIDER_SITE_OTHER): Payer: BLUE CROSS/BLUE SHIELD | Admitting: Certified Nurse Midwife

## 2016-03-15 VITALS — BP 118/67 | HR 84 | Wt 214.4 lb

## 2016-03-15 DIAGNOSIS — Z3493 Encounter for supervision of normal pregnancy, unspecified, third trimester: Secondary | ICD-10-CM

## 2016-03-15 LAB — POCT URINALYSIS DIPSTICK
Bilirubin, UA: NEGATIVE
Blood, UA: NEGATIVE
Glucose, UA: NEGATIVE
Ketones, UA: NEGATIVE
LEUKOCYTES UA: NEGATIVE
NITRITE UA: NEGATIVE
PROTEIN UA: NEGATIVE
Spec Grav, UA: 1.01
UROBILINOGEN UA: NEGATIVE
pH, UA: 5

## 2016-03-15 NOTE — Progress Notes (Signed)
Pt has no complaints

## 2016-03-15 NOTE — Progress Notes (Signed)
ROB-Pt doing well. No questions or concerns. Reviewed red flag symptoms and when to call. RTC x 2-3 wks for ROB and cultures.

## 2016-03-31 DIAGNOSIS — Z0289 Encounter for other administrative examinations: Secondary | ICD-10-CM

## 2016-04-06 ENCOUNTER — Ambulatory Visit (INDEPENDENT_AMBULATORY_CARE_PROVIDER_SITE_OTHER): Payer: BLUE CROSS/BLUE SHIELD | Admitting: Obstetrics and Gynecology

## 2016-04-06 VITALS — BP 108/67 | HR 80 | Wt 219.8 lb

## 2016-04-06 DIAGNOSIS — Z3685 Encounter for antenatal screening for Streptococcus B: Secondary | ICD-10-CM

## 2016-04-06 DIAGNOSIS — Z113 Encounter for screening for infections with a predominantly sexual mode of transmission: Secondary | ICD-10-CM

## 2016-04-06 DIAGNOSIS — O09523 Supervision of elderly multigravida, third trimester: Secondary | ICD-10-CM

## 2016-04-06 LAB — POCT URINALYSIS DIPSTICK
BILIRUBIN UA: NEGATIVE
Blood, UA: NEGATIVE
Glucose, UA: NEGATIVE
KETONES UA: NEGATIVE
LEUKOCYTES UA: NEGATIVE
NITRITE UA: NEGATIVE
PH UA: 7
PROTEIN UA: NEGATIVE
Spec Grav, UA: <=1.005
Urobilinogen, UA: NEGATIVE

## 2016-04-06 LAB — OB RESULTS CONSOLE GBS: GBS: NEGATIVE

## 2016-04-06 NOTE — Progress Notes (Signed)
ROB: Doing well, denies complaints. Discussed labor precautions. 36 week labs done today. RTC in 1 week.

## 2016-04-08 LAB — GC/CHLAMYDIA PROBE AMP
Chlamydia trachomatis, NAA: NEGATIVE
NEISSERIA GONORRHOEAE BY PCR: NEGATIVE

## 2016-04-08 LAB — PLEASE NOTE

## 2016-04-08 LAB — STREP GP B NAA+RFLX: STREP GP B NAA+RFLX: NEGATIVE

## 2016-04-13 ENCOUNTER — Ambulatory Visit (INDEPENDENT_AMBULATORY_CARE_PROVIDER_SITE_OTHER): Payer: BLUE CROSS/BLUE SHIELD | Admitting: Obstetrics and Gynecology

## 2016-04-13 VITALS — BP 109/69 | HR 80 | Wt 225.2 lb

## 2016-04-13 DIAGNOSIS — O09523 Supervision of elderly multigravida, third trimester: Secondary | ICD-10-CM

## 2016-04-13 DIAGNOSIS — Z98891 History of uterine scar from previous surgery: Secondary | ICD-10-CM

## 2016-04-13 LAB — POCT URINALYSIS DIPSTICK
BILIRUBIN UA: NEGATIVE
Glucose, UA: NEGATIVE
KETONES UA: NEGATIVE
NITRITE UA: NEGATIVE
PH UA: 6.5
PROTEIN UA: NEGATIVE
Spec Grav, UA: 1.015
Urobilinogen, UA: NEGATIVE

## 2016-04-13 NOTE — Progress Notes (Signed)
ROB: Denies complaints.  Reiterated labor precautions. GBS neg.  RTC in 1 week.

## 2016-04-20 ENCOUNTER — Ambulatory Visit (INDEPENDENT_AMBULATORY_CARE_PROVIDER_SITE_OTHER): Payer: BLUE CROSS/BLUE SHIELD | Admitting: Obstetrics and Gynecology

## 2016-04-20 VITALS — BP 111/68 | HR 80 | Wt 224.4 lb

## 2016-04-20 DIAGNOSIS — Z98891 History of uterine scar from previous surgery: Secondary | ICD-10-CM

## 2016-04-20 DIAGNOSIS — O09523 Supervision of elderly multigravida, third trimester: Secondary | ICD-10-CM

## 2016-04-20 LAB — POCT URINALYSIS DIPSTICK
Bilirubin, UA: NEGATIVE
Glucose, UA: NEGATIVE
KETONES UA: NEGATIVE
Nitrite, UA: NEGATIVE
PROTEIN UA: NEGATIVE
SPEC GRAV UA: 1.01
UROBILINOGEN UA: NEGATIVE
pH, UA: 6.5

## 2016-04-20 NOTE — Progress Notes (Signed)
ROB: Doing well.  Feeling slightly more pelvic pressure. Reiterated labor precautions. RTC in 1 week.

## 2016-04-27 ENCOUNTER — Ambulatory Visit (INDEPENDENT_AMBULATORY_CARE_PROVIDER_SITE_OTHER): Payer: BLUE CROSS/BLUE SHIELD | Admitting: Obstetrics and Gynecology

## 2016-04-27 VITALS — BP 111/71 | HR 88 | Wt 225.3 lb

## 2016-04-27 DIAGNOSIS — Z98891 History of uterine scar from previous surgery: Secondary | ICD-10-CM

## 2016-04-27 DIAGNOSIS — O480 Post-term pregnancy: Secondary | ICD-10-CM

## 2016-04-27 DIAGNOSIS — O09523 Supervision of elderly multigravida, third trimester: Secondary | ICD-10-CM

## 2016-04-27 LAB — POCT URINALYSIS DIPSTICK
BILIRUBIN UA: NEGATIVE
Glucose, UA: NEGATIVE
KETONES UA: NEGATIVE
Nitrite, UA: NEGATIVE
PROTEIN UA: NEGATIVE
Spec Grav, UA: 1.02
Urobilinogen, UA: NEGATIVE
pH, UA: 6.5

## 2016-04-27 NOTE — Addendum Note (Signed)
Addended by: Fabian NovemberHERRY, Jatavion Peaster S on: 04/27/2016 09:26 PM   Modules accepted: Orders, SmartSet

## 2016-04-27 NOTE — Progress Notes (Signed)
ROB: Patient doing well, notes irregular contractions beginning yesterday.  Reiterated labor precautions.  Discussed need for C-section if no delivery after 40 weeks.  Patient to f/u on Monday for AFI and NST.  Tuesday for clinic visit.  If still undelivered, will be scheduled for C-section next Wednesday (05/04/2016).

## 2016-04-28 ENCOUNTER — Inpatient Hospital Stay
Admission: EM | Admit: 2016-04-28 | Discharge: 2016-05-01 | DRG: 766 | Disposition: A | Discharge status: Home / Self Care | Payer: BLUE CROSS/BLUE SHIELD | Attending: Obstetrics and Gynecology | Admitting: Obstetrics and Gynecology

## 2016-04-28 ENCOUNTER — Encounter: Payer: Self-pay | Admitting: *Deleted

## 2016-04-28 DIAGNOSIS — Z8249 Family history of ischemic heart disease and other diseases of the circulatory system: Secondary | ICD-10-CM | POA: Diagnosis not present

## 2016-04-28 DIAGNOSIS — O9952 Diseases of the respiratory system complicating childbirth: Secondary | ICD-10-CM | POA: Diagnosis present

## 2016-04-28 DIAGNOSIS — Z3A39 39 weeks gestation of pregnancy: Secondary | ICD-10-CM

## 2016-04-28 DIAGNOSIS — O4202 Full-term premature rupture of membranes, onset of labor within 24 hours of rupture: Secondary | ICD-10-CM | POA: Diagnosis present

## 2016-04-28 DIAGNOSIS — O34211 Maternal care for low transverse scar from previous cesarean delivery: Secondary | ICD-10-CM | POA: Diagnosis present

## 2016-04-28 DIAGNOSIS — O9962 Diseases of the digestive system complicating childbirth: Secondary | ICD-10-CM | POA: Diagnosis present

## 2016-04-28 DIAGNOSIS — K219 Gastro-esophageal reflux disease without esophagitis: Secondary | ICD-10-CM | POA: Diagnosis present

## 2016-04-28 DIAGNOSIS — J4520 Mild intermittent asthma, uncomplicated: Secondary | ICD-10-CM | POA: Diagnosis present

## 2016-04-28 DIAGNOSIS — Z833 Family history of diabetes mellitus: Secondary | ICD-10-CM

## 2016-04-28 LAB — CBC
HCT: 36.6 % (ref 35.0–47.0)
Hemoglobin: 12.5 g/dL (ref 12.0–16.0)
MCH: 27.2 pg (ref 26.0–34.0)
MCHC: 34.2 g/dL (ref 32.0–36.0)
MCV: 79.5 fL — ABNORMAL LOW (ref 80.0–100.0)
Platelets: 240 10*3/uL (ref 150–440)
RBC: 4.6 MIL/uL (ref 3.80–5.20)
RDW: 14.5 % (ref 11.5–14.5)
WBC: 11.4 10*3/uL — ABNORMAL HIGH (ref 3.6–11.0)

## 2016-04-28 LAB — TYPE AND SCREEN
ABO/RH(D): A POS
Antibody Screen: NEGATIVE

## 2016-04-28 LAB — RAPID HIV SCREEN (HIV 1/2 AB+AG)
HIV 1/2 ANTIBODIES: NONREACTIVE
HIV-1 P24 ANTIGEN - HIV24: NONREACTIVE

## 2016-04-28 MED ORDER — OXYTOCIN 40 UNITS IN LACTATED RINGERS INFUSION - SIMPLE MED: 2.5000 [IU]/h | INTRAVENOUS | Status: DC

## 2016-04-28 MED ORDER — OXYCODONE-ACETAMINOPHEN 5-325 MG PO TABS: 2.0000 | ORAL_TABLET | ORAL | Status: DC | PRN

## 2016-04-28 MED ORDER — SOD CITRATE-CITRIC ACID 500-334 MG/5ML PO SOLN
30.0000 mL | ORAL | Status: DC | PRN
  Filled 2016-04-28: qty 15

## 2016-04-28 MED ORDER — LIDOCAINE HCL (PF) 1 % IJ SOLN: 30.0000 mL | INTRAMUSCULAR | Status: DC | PRN

## 2016-04-28 MED ORDER — SODIUM CHLORIDE FLUSH 0.9 % IV SOLN
INTRAVENOUS | Status: AC
  Filled 2016-04-28: qty 10

## 2016-04-28 MED ORDER — OXYTOCIN 40 UNITS IN LACTATED RINGERS INFUSION - SIMPLE MED
INTRAVENOUS | Status: AC
  Filled 2016-04-28: qty 1000

## 2016-04-28 MED ORDER — AMMONIA AROMATIC IN INHA
RESPIRATORY_TRACT | Status: AC
  Filled 2016-04-28: qty 10

## 2016-04-28 MED ORDER — LIDOCAINE HCL (PF) 1 % IJ SOLN
INTRAMUSCULAR | Status: AC
  Filled 2016-04-28: qty 30

## 2016-04-28 MED ORDER — FENTANYL CITRATE (PF) 100 MCG/2ML IJ SOLN
100.0000 ug | Freq: Once | INTRAMUSCULAR | Status: AC
  Administered 2016-04-28: 100 ug via INTRAVENOUS

## 2016-04-28 MED ORDER — LACTATED RINGERS IV SOLN
INTRAVENOUS | Status: DC
  Administered 2016-04-28: 21:00:00 via INTRAVENOUS

## 2016-04-28 MED ORDER — LACTATED RINGERS IV SOLN: 500.0000 mL | INTRAVENOUS | Status: DC | PRN

## 2016-04-28 MED ORDER — MISOPROSTOL 200 MCG PO TABS
ORAL_TABLET | ORAL | Status: AC
  Filled 2016-04-28: qty 4

## 2016-04-28 MED ORDER — LACTATED RINGERS IV SOLN: INTRAVENOUS | Status: DC

## 2016-04-28 MED ORDER — OXYTOCIN 10 UNIT/ML IJ SOLN
INTRAMUSCULAR | Status: AC
  Filled 2016-04-28: qty 2

## 2016-04-28 MED ORDER — OXYTOCIN BOLUS FROM INFUSION: 500.0000 mL | Freq: Once | INTRAVENOUS | Status: DC

## 2016-04-28 MED ORDER — ONDANSETRON HCL 4 MG/2ML IJ SOLN: 4.0000 mg | Freq: Four times a day (QID) | INTRAMUSCULAR | Status: DC | PRN

## 2016-04-28 MED ORDER — OXYCODONE-ACETAMINOPHEN 5-325 MG PO TABS: 1.0000 | ORAL_TABLET | ORAL | Status: DC | PRN

## 2016-04-28 MED ORDER — SODIUM CHLORIDE FLUSH 0.9 % IV SOLN
INTRAVENOUS | Status: AC
  Filled 2016-04-28: qty 20

## 2016-04-28 MED ORDER — ACETAMINOPHEN 325 MG PO TABS: 650.0000 mg | ORAL_TABLET | ORAL | Status: DC | PRN

## 2016-04-28 MED ORDER — BUTORPHANOL TARTRATE 1 MG/ML IJ SOLN: 1.0000 mg | INTRAMUSCULAR | Status: DC | PRN

## 2016-04-28 MED ORDER — TERBUTALINE SULFATE 1 MG/ML IJ SOLN
0.2500 mg | Freq: Once | INTRAMUSCULAR | Status: AC | PRN
  Administered 2016-04-29: 0.25 mg via SUBCUTANEOUS
  Filled 2016-04-28: qty 1

## 2016-04-28 MED ORDER — FENTANYL CITRATE (PF) 100 MCG/2ML IJ SOLN
INTRAMUSCULAR | Status: AC
  Administered 2016-04-28: 100 ug via INTRAVENOUS
  Filled 2016-04-28: qty 2

## 2016-04-28 MED ORDER — OXYTOCIN 40 UNITS IN LACTATED RINGERS INFUSION - SIMPLE MED
1.0000 m[IU]/min | INTRAVENOUS | Status: DC
  Administered 2016-04-28: 2 m[IU]/min via INTRAVENOUS

## 2016-04-28 NOTE — OB Triage Note (Signed)
Pt presents with complaint of SROM at approximately 10am. Denies contractions.

## 2016-04-28 NOTE — Progress Notes (Signed)
Intrapartum Progress Note  S: Patient doing well, no complaints.   O: Blood pressure 138/83, pulse (!) 105, temperature 97.7 F (36.5 C), temperature source Oral, resp. rate 20, height 5\' 3"  (1.6 m), weight 225 lb (102.1 kg), last menstrual period 07/25/2015. Gen App: NAD, comfortable Abdomen: soft, gravid FHT: baseline 145 bpm.  Accels present.  Decels absent. moderate in degree variability.   Tocometer: Infrequent Cervix: 1/40/ballotable Extremities: Nontender, no edema.  Labs:  Lab Results  Component Value Date   ABORH A POS 04/28/2016    Assessment:  1: SIUP at 4884w5d 2. Prior C-section x 1 with subsequent VBAC, desires TOLAC 3. GBS negative  Plan:  1. Patient with PROM now for 9 hours, no signs of labor as of yet. Will augment with Pitocin. To initiate VBAC protocol.  2.  Monitor for s/s of chorioamnionitis if prolonged rupture.  3. Anticipate vaginal delivery.   Hildred LaserAnika Areanna Gengler, MD 04/28/2016 7:44 PM

## 2016-04-28 NOTE — Progress Notes (Signed)
Intrapartum Progress Note  S: Patient notes she can feel some contractions, mild. On left side with facemask O2.  O: Blood pressure 126/77, pulse 87, temperature 97.9 F (36.6 C), temperature source Oral, resp. rate 18, height 5\' 3"  (1.6 m), weight 225 lb (102.1 kg), last menstrual period 07/25/2015. Gen App: NAD, comfortable Abdomen: soft, gravid FHT: baseline 145 bpm.  Accels present.  Decels present - variable and late decelerations from baseline to 110s with good return, lasting 30 secs or less. Moderate in degree variability.   Tocometer: Infrequent Cervix: 3/70-80/-3 Extremities: Nontender, no edema.  Pitocin: 2 mIU  Labs: No new labs  Assessment:  1: SIUP at 7332w5d 2. Prior C-section x 1 with subsequent VBAC, desires TOLAC 3. GBS negative 4. Category II tracing  Plan:  1. Pitocin held for Category II tracing.  In left lateral position with facemask O2.  Placed IUPC and FSE.  Pitocin held.  Will start amnioinfusion.  Once tracing Category 1, can resume  2.  Monitor for s/s of chorioamnionitis if prolonged rupture.  3. Anticipate vaginal delivery, however discussion had with patient that if we cannot proceed with IOL due to fetal intolerance, the recommendation would be for repeat C-section.    Hildred LaserAnika Ewan Grau, MD 04/28/2016 9:52 PM

## 2016-04-28 NOTE — H&P (Signed)
Obstetric History and Physical  Whitney Velasquez is a 36 y.o. [id number] with IUP at 3157w5d presenting for complaints of SROM at 10:00 a.m. Patient states she has been having  none contractions, none vaginal bleeding, ruptured, clear fluid membranes, with active fetal movement.    Prenatal Course Source of Care: [facility] with onset of care at 6 weeks Pregnancy complications or risks: Patient Active Problem List   Diagnosis Date Noted  . Labor and delivery indication for care or intervention 04/28/2016  . H/O cesarean section 10/18/2015  . H/O miscarriage, currently pregnant 10/18/2015  . H/O abnormal cervical Papanicolaou smear 10/18/2015  . Supervision of elderly multigravida (>=966 years old at time of delivery), third trimester 10/18/2015  . Abnormal laboratory test 09/10/2015  . Adiposity 06/30/2015  . Asthma, mild intermittent 04/02/2015   She plans to breastfeed She desires NuvaRing vaginal inserts for postpartum contraception.   Prenatal labs and studies: ABO, Rh: --/--/PENDING (03/01 1141)  A/Positive from history. Antibody: Negative (07/13 1147) Rubella: 8.88 (07/13 1147) RPR: Non Reactive (07/13 1147)  HBsAg: Negative (07/13 1147)  HIV: Non Reactive (07/13 1147)  JYN:WGNFAOZHGBS:Negative (02/07 1514) 1 hr Glucola  normal Genetic screening declined Anatomy US normal   Past Medical History:  Diagnosis Date  . Asthma    Last asthma exacerbation requiring hospitalization was in 2013, no intubations.  . Vaginal Pap smear, abnormal    was scheduled for colpo but found out she was pregnant    Past Surgical History:  Procedure Laterality Date  . CESAREAN SECTION      OB History  Gravida Para Term Preterm AB Living  5 2 2   2 3   SAB TAB Ectopic Multiple Live Births  1     1 3     # Outcome Date GA Lbr Len/2nd Weight Sex Delivery Anes PTL Lv  5 Current           4 SAB 04/2015        FD  3A Term 2004 2061w0d  4 lb 9 oz (2.07 kg) F Vag-Spont  N LIV  3B Term 2004 4361w0d  4  lb 4 oz (1.928 kg) M Vag-Spont  N LIV  2 Term 2000 5261w0d  8 lb 2.6 oz (3.702 kg) M CS-Unspec  N LIV     Complications: Failure to Progress in First Stage  1 AB 1998        FD    Obstetric Comments  2000-failure to progress  2004 twins-female and female    Social History   Social History  . Marital status: Married    Spouse name: N/A  . Number of children: N/A  . Years of education: N/A   Occupational History  . fiancial     [employer]   Social History Main Topics  . Smoking status: Never Smoker  . Smokeless tobacco: Never Used  . Alcohol use 0.0 oz/week  . Drug use: No  . Sexual activity: Yes    Partners: Male   Other Topics Concern  . None   Social History Narrative  . None    Family History  Problem Relation Age of Onset  . Fibroids Mother   . Diabetes Maternal Grandmother   . Heart disease Maternal Grandmother     CAD  . Cancer Paternal Grandfather   . Rashes / Skin problems Son     eczema  . Rashes / Skin problems Daughter     eczema    Prescriptions Prior to Admission  Medication Sig Dispense Refill Last Dose  . albuterol (ACCUNEB) 1.25 MG/3ML nebulizer solution Inhale into the lungs.   Past Month at Unknown time  . albuterol (PROAIR HFA) 108 (90 Base) MCG/ACT inhaler Inhale into the lungs.   Past Month at Unknown time  . Prenatal Vit-Fe Fumarate-FA (PRENATAL VITAMINS PLUS PO) Take 1 tablet by mouth daily.   04/27/2016 at Unknown time  . Fluticasone-Salmeterol (ADVAIR DISKUS) 250-50 MCG/DOSE AEPB Inhale 1 Inhaler into the lungs every 12 (twelve) hours. Inhale 1 inhalation into the lungs every 12 (twelve) hours.   Taking    Allergies  Allergen Reactions  . Penicillin G Other (See Comments)    unknown    Review of Systems: Negative except for what is mentioned in HPI.  Physical Exam: BP 138/83 (BP Location: Left Arm)   Pulse (!) 105   Temp 98 F (36.7 C) (Oral)   Resp 20   Ht 5\' 3"  (1.6 m)   Wt 225 lb (102.1 kg)   LMP 07/25/2015  (Approximate)   BMI 39.86 kg/m  CONSTITUTIONAL: Well-developed, well-nourished female in no acute distress.  HENT:  Normocephalic, atraumatic, External right and left ear normal. Oropharynx is clear and moist EYES: Conjunctivae and EOM are normal. Pupils are equal, round, and reactive to light. No scleral icterus.  NECK: Normal range of motion, supple, no masses SKIN: Skin is warm and dry. No rash noted. Not diaphoretic. No erythema. No pallor. NEUROLOGIC: Alert and oriented to person, place, and time. Normal reflexes, muscle tone coordination. No cranial nerve deficit noted. PSYCHIATRIC: Normal mood and affect. Normal behavior. Normal judgment and thought content. CARDIOVASCULAR: Normal heart rate noted, regular rhythm RESPIRATORY: Effort and breath sounds normal, no problems with respiration noted ABDOMEN: Soft, nontender, nondistended, gravid. MUSCULOSKELETAL: Normal range of motion. No edema and no tenderness. 2+ distal pulses.  Cervical Exam: Dilatation 1 cm   Effacement 40%   Station ballotable   Presentation: cephalic FHT:  Baseline rate 145 bpm   Variability moderate  Accelerations present   Decelerations none Contractions: Infrequent   Pertinent Labs/Studies:   Results for orders placed or performed during the hospital encounter of 04/28/16 (from the past 24 hour(s))  CBC     Status: Abnormal   Collection Time: 04/28/16 11:41 AM  Result Value Ref Range   WBC 11.4 (H) 3.6 - 11.0 K/uL   RBC 4.60 3.80 - 5.20 MIL/uL   Hemoglobin 12.5 12.0 - 16.0 g/dL   HCT 16.1 09.6 - 04.5 %   MCV 79.5 (L) 80.0 - 100.0 fL   MCH 27.2 26.0 - 34.0 pg   MCHC 34.2 32.0 - 36.0 g/dL   RDW 40.9 81.1 - 91.4 %   Platelets 240 150 - 440 K/uL  Type and screen     Status: None (Preliminary result)   Collection Time: 04/28/16 11:41 AM  Result Value Ref Range   ABO/RH(D) PENDING    Antibody Screen PENDING    Sample Expiration 05/01/2016     Assessment : Whitney Velasquez is a 36 y.o. [id number] at [redacted]w[redacted]d  being admitted for PROM.  Prior C-section x 1 with successful VBAC.  Desires TOLAC.   Plan: Labor: Expectant management.  Augmentation as needed, per protocol FWB: Reassuring fetal heart tracing.  GBS negative Delivery plan: Hopeful for vaginal delivery   Hildred Laser, MD [facility]

## 2016-04-29 ENCOUNTER — Encounter: Payer: Self-pay | Admitting: *Deleted

## 2016-04-29 ENCOUNTER — Inpatient Hospital Stay: Payer: BLUE CROSS/BLUE SHIELD | Admitting: Anesthesiology

## 2016-04-29 ENCOUNTER — Encounter
Admission: EM | Disposition: A | Discharge status: Home / Self Care | Payer: Self-pay | Source: Home / Self Care | Attending: Obstetrics and Gynecology

## 2016-04-29 DIAGNOSIS — Z3A39 39 weeks gestation of pregnancy: Secondary | ICD-10-CM

## 2016-04-29 LAB — RPR: RPR: NONREACTIVE

## 2016-04-29 SURGERY — Surgical Case
Anesthesia: Spinal | Site: Abdomen | Wound class: Clean Contaminated

## 2016-04-29 MED ORDER — IBUPROFEN 600 MG PO TABS
600.0000 mg | ORAL_TABLET | Freq: Four times a day (QID) | ORAL | Status: DC
  Administered 2016-04-29 – 2016-05-01 (×9): 600 mg via ORAL
  Filled 2016-04-29 (×8): qty 1

## 2016-04-29 MED ORDER — ACETAMINOPHEN 325 MG PO TABS: 650.0000 mg | ORAL_TABLET | ORAL | Status: DC | PRN

## 2016-04-29 MED ORDER — SOD CITRATE-CITRIC ACID 500-334 MG/5ML PO SOLN
30.0000 mL | ORAL | Status: AC
  Administered 2016-04-29: 30 mL via ORAL

## 2016-04-29 MED ORDER — OXYTOCIN 40 UNITS IN LACTATED RINGERS INFUSION - SIMPLE MED: 2.5000 [IU]/h | INTRAVENOUS | Status: AC

## 2016-04-29 MED ORDER — GENTAMICIN SULFATE 40 MG/ML IJ SOLN
5.0000 mg/kg | INTRAVENOUS | Status: AC
  Administered 2016-04-29: 510 mg via INTRAVENOUS
  Filled 2016-04-29: qty 12.75

## 2016-04-29 MED ORDER — DIPHENHYDRAMINE HCL 25 MG PO CAPS: 25.0000 mg | ORAL_CAPSULE | ORAL | Status: DC | PRN

## 2016-04-29 MED ORDER — FERROUS SULFATE 325 (65 FE) MG PO TABS
325.0000 mg | ORAL_TABLET | Freq: Two times a day (BID) | ORAL | Status: DC
  Administered 2016-04-29 – 2016-05-01 (×4): 325 mg via ORAL
  Filled 2016-04-29 (×4): qty 1

## 2016-04-29 MED ORDER — PHENYLEPHRINE HCL 10 MG/ML IJ SOLN
INTRAMUSCULAR | Status: DC | PRN
  Administered 2016-04-29: 100 ug via INTRAVENOUS
  Administered 2016-04-29: 50 ug via INTRAVENOUS
  Administered 2016-04-29 (×2): 100 ug via INTRAVENOUS

## 2016-04-29 MED ORDER — SIMETHICONE 80 MG PO CHEW: 80.0000 mg | CHEWABLE_TABLET | ORAL | Status: DC | PRN

## 2016-04-29 MED ORDER — BUPIVACAINE IN DEXTROSE 0.75-8.25 % IT SOLN
INTRATHECAL | Status: DC | PRN
  Administered 2016-04-29: 1.6 mL via INTRATHECAL

## 2016-04-29 MED ORDER — LIDOCAINE 5 % EX PTCH
MEDICATED_PATCH | CUTANEOUS | Status: AC
  Filled 2016-04-29: qty 1

## 2016-04-29 MED ORDER — LIDOCAINE 5 % EX PTCH
MEDICATED_PATCH | CUTANEOUS | Status: DC | PRN
  Administered 2016-04-29: 1 via TRANSDERMAL

## 2016-04-29 MED ORDER — ZOLPIDEM TARTRATE 5 MG PO TABS: 5.0000 mg | ORAL_TABLET | Freq: Every evening | ORAL | Status: DC | PRN

## 2016-04-29 MED ORDER — FENTANYL CITRATE (PF) 100 MCG/2ML IJ SOLN: 25.0000 ug | INTRAMUSCULAR | Status: DC | PRN

## 2016-04-29 MED ORDER — EPHEDRINE SULFATE 50 MG/ML IJ SOLN
INTRAMUSCULAR | Status: DC | PRN
  Administered 2016-04-29: 5 mg via INTRAVENOUS

## 2016-04-29 MED ORDER — IBUPROFEN 600 MG PO TABS
600.0000 mg | ORAL_TABLET | Freq: Four times a day (QID) | ORAL | Status: DC | PRN
  Filled 2016-04-29: qty 1

## 2016-04-29 MED ORDER — OXYCODONE-ACETAMINOPHEN 5-325 MG PO TABS: 2.0000 | ORAL_TABLET | ORAL | Status: DC | PRN

## 2016-04-29 MED ORDER — MORPHINE SULFATE (PF) 0.5 MG/ML IJ SOLN
INTRAMUSCULAR | Status: DC | PRN
  Administered 2016-04-29: .1 mg via INTRATHECAL

## 2016-04-29 MED ORDER — MAGNESIUM HYDROXIDE 400 MG/5ML PO SUSP: 30.0000 mL | ORAL | Status: DC | PRN

## 2016-04-29 MED ORDER — NALOXONE HCL 0.4 MG/ML IJ SOLN: 0.4000 mg | INTRAMUSCULAR | Status: DC | PRN

## 2016-04-29 MED ORDER — ONDANSETRON HCL 4 MG/2ML IJ SOLN
INTRAMUSCULAR | Status: DC | PRN
  Administered 2016-04-29: 4 mg via INTRAVENOUS

## 2016-04-29 MED ORDER — SCOPOLAMINE 1 MG/3DAYS TD PT72: 1.0000 | MEDICATED_PATCH | Freq: Once | TRANSDERMAL | Status: DC

## 2016-04-29 MED ORDER — NALBUPHINE HCL 10 MG/ML IJ SOLN
5.0000 mg | Freq: Once | INTRAMUSCULAR | Status: AC | PRN
  Administered 2016-04-29: 5 mg via INTRAVENOUS

## 2016-04-29 MED ORDER — SENNOSIDES-DOCUSATE SODIUM 8.6-50 MG PO TABS
2.0000 | ORAL_TABLET | ORAL | Status: DC
  Administered 2016-04-30: 2 via ORAL
  Filled 2016-04-29 (×2): qty 2

## 2016-04-29 MED ORDER — WITCH HAZEL-GLYCERIN EX PADS: 1.0000 "application " | MEDICATED_PAD | CUTANEOUS | Status: DC | PRN

## 2016-04-29 MED ORDER — ONDANSETRON HCL 4 MG/2ML IJ SOLN
INTRAMUSCULAR | Status: AC
  Filled 2016-04-29: qty 2

## 2016-04-29 MED ORDER — DIPHENHYDRAMINE HCL 25 MG PO CAPS: 25.0000 mg | ORAL_CAPSULE | Freq: Four times a day (QID) | ORAL | Status: DC | PRN

## 2016-04-29 MED ORDER — SIMETHICONE 80 MG PO CHEW
80.0000 mg | CHEWABLE_TABLET | ORAL | Status: DC
  Administered 2016-04-30 – 2016-05-01 (×2): 80 mg via ORAL
  Filled 2016-04-29 (×2): qty 1

## 2016-04-29 MED ORDER — OXYCODONE-ACETAMINOPHEN 5-325 MG PO TABS: 1.0000 | ORAL_TABLET | ORAL | Status: DC | PRN

## 2016-04-29 MED ORDER — CLINDAMYCIN PHOSPHATE 900 MG/50ML IV SOLN
900.0000 mg | INTRAVENOUS | Status: DC
  Filled 2016-04-29: qty 50

## 2016-04-29 MED ORDER — ONDANSETRON HCL 4 MG/2ML IJ SOLN: 4.0000 mg | Freq: Once | INTRAMUSCULAR | Status: DC | PRN

## 2016-04-29 MED ORDER — SODIUM CHLORIDE 0.9% FLUSH: 3.0000 mL | INTRAVENOUS | Status: DC | PRN

## 2016-04-29 MED ORDER — MENTHOL 3 MG MT LOZG
1.0000 | LOZENGE | OROMUCOSAL | Status: DC | PRN
  Filled 2016-04-29: qty 9

## 2016-04-29 MED ORDER — PRENATAL MULTIVITAMIN CH
1.0000 | ORAL_TABLET | Freq: Every day | ORAL | Status: DC
  Administered 2016-04-29 – 2016-04-30 (×2): 1 via ORAL
  Filled 2016-04-29 (×2): qty 1

## 2016-04-29 MED ORDER — ONDANSETRON HCL 4 MG/2ML IJ SOLN: 4.0000 mg | Freq: Three times a day (TID) | INTRAMUSCULAR | Status: DC | PRN

## 2016-04-29 MED ORDER — OXYTOCIN 40 UNITS IN LACTATED RINGERS INFUSION - SIMPLE MED
INTRAVENOUS | Status: DC | PRN
  Administered 2016-04-29: 1000 mL via INTRAVENOUS

## 2016-04-29 MED ORDER — OXYTOCIN 40 UNITS IN LACTATED RINGERS INFUSION - SIMPLE MED
INTRAVENOUS | Status: AC
  Filled 2016-04-29: qty 1000

## 2016-04-29 MED ORDER — NALBUPHINE HCL 10 MG/ML IJ SOLN
5.0000 mg | INTRAMUSCULAR | Status: DC | PRN
  Filled 2016-04-29: qty 1

## 2016-04-29 MED ORDER — DIBUCAINE 1 % RE OINT: 1.0000 "application " | TOPICAL_OINTMENT | RECTAL | Status: DC | PRN

## 2016-04-29 MED ORDER — DIPHENHYDRAMINE HCL 50 MG/ML IJ SOLN: 12.5000 mg | INTRAMUSCULAR | Status: DC | PRN

## 2016-04-29 MED ORDER — NALBUPHINE HCL 10 MG/ML IJ SOLN: 5.0000 mg | Freq: Once | INTRAMUSCULAR | Status: AC | PRN

## 2016-04-29 MED ORDER — MEPERIDINE HCL 25 MG/ML IJ SOLN: 6.2500 mg | INTRAMUSCULAR | Status: DC | PRN

## 2016-04-29 MED ORDER — NALOXONE HCL 2 MG/2ML IJ SOSY
1.0000 ug/kg/h | PREFILLED_SYRINGE | INTRAMUSCULAR | Status: DC | PRN
  Filled 2016-04-29: qty 2

## 2016-04-29 MED ORDER — NALBUPHINE HCL 10 MG/ML IJ SOLN: 5.0000 mg | INTRAMUSCULAR | Status: DC | PRN

## 2016-04-29 MED ORDER — FENTANYL CITRATE (PF) 100 MCG/2ML IJ SOLN
INTRAMUSCULAR | Status: AC
  Administered 2016-04-29: 100 ug via INTRAVENOUS
  Filled 2016-04-29: qty 2

## 2016-04-29 MED ORDER — COCONUT OIL OIL
1.0000 "application " | TOPICAL_OIL | Status: DC | PRN
  Administered 2016-04-29: 1 via TOPICAL
  Filled 2016-04-29: qty 120

## 2016-04-29 MED ORDER — MORPHINE SULFATE (PF) 0.5 MG/ML IJ SOLN
INTRAMUSCULAR | Status: AC
  Filled 2016-04-29: qty 10

## 2016-04-29 MED ORDER — LACTATED RINGERS IV SOLN: INTRAVENOUS | Status: DC

## 2016-04-29 MED ORDER — FENTANYL CITRATE (PF) 100 MCG/2ML IJ SOLN
100.0000 ug | Freq: Once | INTRAMUSCULAR | Status: AC
  Administered 2016-04-29: 100 ug via INTRAVENOUS

## 2016-04-29 SURGICAL SUPPLY — 23 items
BAG COUNTER SPONGE EZ (MISCELLANEOUS) ×4 IMPLANT
CANISTER SUCT 3000ML (MISCELLANEOUS) ×3 IMPLANT
CHLORAPREP W/TINT 26ML (MISCELLANEOUS) ×6 IMPLANT
COUNTER SPONGE BAG EZ (MISCELLANEOUS) ×2
DRSG TELFA 3X8 NADH (GAUZE/BANDAGES/DRESSINGS) ×3 IMPLANT
ELECT REM PT RETURN 9FT ADLT (ELECTROSURGICAL) ×3
ELECTRODE REM PT RTRN 9FT ADLT (ELECTROSURGICAL) ×1 IMPLANT
GAUZE SPONGE 4X4 12PLY STRL (GAUZE/BANDAGES/DRESSINGS) ×3 IMPLANT
GLOVE BIO SURGEON STRL SZ 6.5 (GLOVE) ×8 IMPLANT
GLOVE BIO SURGEONS STRL SZ 6.5 (GLOVE) ×4
GLOVE INDICATOR 7.0 STRL GRN (GLOVE) ×12 IMPLANT
GOWN STRL REUS W/ TWL LRG LVL3 (GOWN DISPOSABLE) ×4 IMPLANT
GOWN STRL REUS W/TWL LRG LVL3 (GOWN DISPOSABLE) ×8
KIT RM TURNOVER STRD PROC AR (KITS) ×3 IMPLANT
NS IRRIG 1000ML POUR BTL (IV SOLUTION) ×3 IMPLANT
PACK C SECTION AR (MISCELLANEOUS) ×3 IMPLANT
PAD OB MATERNITY 4.3X12.25 (PERSONAL CARE ITEMS) ×6 IMPLANT
PAD PREP 24X41 OB/GYN DISP (PERSONAL CARE ITEMS) ×3 IMPLANT
SUT MNCRL AB 4-0 PS2 18 (SUTURE) ×3 IMPLANT
SUT PLAIN 2 0 XLH (SUTURE) IMPLANT
SUT VIC AB 0 CT1 36 (SUTURE) ×12 IMPLANT
SUT VIC AB 3-0 SH 27 (SUTURE) ×2
SUT VIC AB 3-0 SH 27X BRD (SUTURE) ×1 IMPLANT

## 2016-04-29 NOTE — Anesthesia Preprocedure Evaluation (Signed)
Anesthesia Evaluation  Patient identified by MRN, date of birth, ID band Patient awake    Reviewed: Allergy & Precautions, NPO status , Patient's Chart, lab work & pertinent test results  History of Anesthesia Complications Negative for: history of anesthetic complications  Airway Mallampati: II       Dental   Pulmonary asthma ,           Cardiovascular negative cardio ROS       Neuro/Psych negative neurological ROS     GI/Hepatic Neg liver ROS, GERD  ,  Endo/Other  negative endocrine ROS  Renal/GU negative Renal ROS     Musculoskeletal   Abdominal   Peds  Hematology negative hematology ROS (+)   Anesthesia Other Findings   Reproductive/Obstetrics (+) Pregnancy                             Anesthesia Physical Anesthesia Plan  ASA: II and emergent  Anesthesia Plan: Spinal   Post-op Pain Management:    Induction:   Airway Management Planned:   Additional Equipment:   Intra-op Plan:   Post-operative Plan:   Informed Consent: I have reviewed the patients History and Physical, chart, labs and discussed the procedure including the risks, benefits and alternatives for the proposed anesthesia with the patient or authorized representative who has indicated his/her understanding and acceptance.     Plan Discussed with:   Anesthesia Plan Comments:         Anesthesia Quick Evaluation

## 2016-04-29 NOTE — Progress Notes (Signed)
Intrapartum Progress Note  S: Patient without complaints.   O: Blood pressure 118/61, pulse 61, temperature 97.7 F (36.5 C), temperature source Oral, resp. rate 18, height 5\' 3"  (1.6 m), weight 225 lb (102.1 kg), last menstrual period 07/25/2015. Gen App: NAD, mild distress with contractions Abdomen: soft, gravid FHT: baseline 145 bpm.  Accels present.  Decels present - early and variable decelerations present.  Occasional late deceleration present. Moderate in degree variability.   Tocometer: Infrequent Cervix: 3/70-80/-3 Extremities: Nontender, no edema.  Pitocin: Held  Labs: No new labs  Assessment:  1: SIUP at 358w5d 2. Prior C-section x 1 with subsequent VBAC, desires TOLAC 3. GBS negative 4. Category II tracing  Plan:  1. Category II tracing.   Unable to resume Pitocin for induction at this time.  Patient remains unchanged on exam.  Discussion had at this time to proceed with C-section.  Patient in agreement with plan.   Another C-section already en route to OR.  Will give patient dose of terbutaline to halt contractions and proceed to OR once available.  The risks of cesarean section discussed with the patient included but were not limited to: bleeding which may require transfusion or reoperation; infection which may require antibiotics; injury to bowel, bladder, ureters or other surrounding organs; injury to the fetus; need for additional procedures including hysterectomy in the event of a life-threatening hemorrhage; placental abnormalities wth subsequent pregnancies, incisional problems, thromboembolic phenomenon and other postoperative/anesthesia complications.  Anesthesia and OR aware. Preoperative prophylactic antibiotics and SCDs ordered on call to the OR.  To OR when ready. 2.  IV medication for pain as needed.    Hildred LaserAnika Gabe Glace, MD 04/29/2016 1:56 AM

## 2016-04-29 NOTE — Anesthesia Post-op Follow-up Note (Cosign Needed)
Anesthesia QCDR form completed.        

## 2016-04-29 NOTE — Anesthesia Procedure Notes (Signed)
Spinal  Start time: 04/29/2016 3:45 AM End time: 04/29/2016 3:54 AM Staffing Anesthesiologist: Naomie DeanKEPHART, WILLIAM K Resident/CRNA: Irving BurtonBACHICH, Mackensie Pilson Performed: anesthesiologist  Preanesthetic Checklist Completed: patient identified, site marked, surgical consent, pre-op evaluation, IV checked, risks and benefits discussed and monitors and equipment checked Spinal Block Patient position: sitting Prep: Betadine Patient monitoring: continuous pulse ox, blood pressure and heart rate Approach: midline Location: L3-4 Injection technique: single-shot Needle Needle type: Pencan  Needle gauge: 24 G Needle length: 9 cm

## 2016-04-29 NOTE — Progress Notes (Signed)
Intrapartum Progress Note  S: Patient notes contractions are stronger. On left side with facemask O2.  O: Blood pressure 126/77, pulse 87, temperature 97.9 F (36.6 C), temperature source Oral, resp. rate 18, height 5\' 3"  (1.6 m), weight 225 lb (102.1 kg), last menstrual period 07/25/2015. Gen App: NAD, mild distress with contractions Abdomen: soft, gravid FHT: baseline 145 bpm.  Accels present.  Decels present - variable and late decelerations from baseline to 100s with good return, lasting 30 secs or less. Moderate in degree variability.   Tocometer: Infrequent Cervix: 3/70-80/-3 Extremities: Nontender, no edema.  Pitocin: Held  Labs: No new labs  Assessment:  1: SIUP at 2854w5d 2. Prior C-section x 1 with subsequent VBAC, desires TOLAC 3. GBS negative 4. Category II tracing  Plan:  1. Category II tracing.  Patient has been on her back for the past 30-45 minutes.  Will turn back in left lateral position with facemask O2. Continue amnioinfusion.  If no further improvement in next 30 minutes, discussed need to proceed with C-section.  Patient in agreement with plan.  2.  Monitor for s/s of chorioamnionitis if prolonged rupture.     Hildred LaserAnika Marcelyn Ruppe, MD 04/29/2016 12:31 AM

## 2016-04-29 NOTE — Transfer of Care (Signed)
Immediate Anesthesia Transfer of Care Note  Patient: Norman Galen  Procedure(s) Performed: Procedure(s): CESAREAN SECTION (N/A)  Patient Location: Mother/Baby  Anesthesia Type:Spinal  Level of Consciousness: awake, alert  and oriented  Airway & Oxygen Therapy: Patient Spontanous Breathing  Post-op Assessment: Post -op Vital signs reviewed and stable  Post vital signs: stable  Last Vitals:  Vitals:   04/29/16 0327 04/29/16 0536  BP: 116/62 (!) 138/91  Pulse: (!) 126 71  Resp:  12  Temp: 36.8 C     Last Pain:  Vitals:   04/29/16 0040  TempSrc: Oral         Complications: No apparent anesthesia complications

## 2016-04-29 NOTE — Op Note (Addendum)
Cesarean Section Procedure Note  Indications: non-reassuring fetal status (fetal intolerance to labor)  Pre-operative Diagnosis: 39 week 6 Gi[place]re pregnancy, prior C-section x 1 with subsequent VBAC, fetal intolerance to labor.  Post-operative Diagnosis: same with pelvic adhesions  Surgeon: Hildred LaserAnika Emmalene Kattner, MD  Assistants: Doreene BurkeAnnie Thompson, CNM  Procedure: Repeat low transverse Cesarean Section with adhesiolysis  Anesthesia: Spinal anesthesia  Procedure Details: The patient was seen in the Holding Room. The risks, benefits, complications, treatment options, and expected outcomes were discussed with the patient.  The patient concurred with the proposed plan, giving informed consent.  The site of surgery properly noted/marked. The patient was taken to the Operating Room, identified as Whitney Velasquez and the procedure verified as C-Section Delivery.  After induction of anesthesia, the patient was draped and prepped in the usual sterile manner.  A Time Out was held and the above information confirmed.  Anesthesia was tested and noted to be adequate. A Pfannenstiel incision was made and carried down through the subcutaneous tissue to the fascia. Fascial incision was made and extended transversely. The fascia was separated from the underlying rectus tissue superiorly and inferiorly. The peritoneum was identified and entered. Peritoneal incision was extended longitudinally. Adhesions of the peritoneum to the anterior uterine surface were observed. These adhesions were lysed with the bovie. The utero-vesical peritoneal reflection was incised transversely and the bladder flap was bluntly freed from the lower uterine segment.  A low transverse uterine incision was made. Delivered from cephalic presentation was a 3280 gram Female with Apgar scores of 9 at one minute and 9 at five minutes. Nuchal cord x 1 After the umbilical cord was clamped and cut cord blood was obtained for evaluation. The placenta was removed intact  and appeared normal. The uterus was exteriorized and cleared of all clots and debris. The uterine outline, tubes and ovaries appeared normal.  There were numerous filmy adhesions of the omentum to the anterior and fundal surface of the uterus.  These were lysed using the bovie.  The uterine incision was closed with running locked sutures of 0-Vicryl.  A second suture of 0-Vicryl was used in an imbricating layer.  Hemostasis was observed. Lavage was carried out until clear. The fascia was then reapproximated with a running suture of 0-Vicryl. The subcutaneous fat layer was reapproximated with 3-0 Vicryl. The skin was reapproximated with 4-0 Monocryl.  Instrument, sponge, and needle counts were correct prior the abdominal closure and at the conclusion of the case.   Findings: Female infant, cephalic presentation, 3280 grams, with Apgar scores of 9 at one minute and 9 at five minutes. Intact placenta with 3 vessel cord.  Nuchal cord x 1, reducible The uterine outline, tubes and ovaries appeared normal.   Estimated Blood Loss:  600 ml      Drains: foley catheter to gravity drainage, 200 clear urine at end of the procedure         Total IV Fluids:  500 ml  Specimens: None         Implants: None         Complications:  None; patient tolerated the procedure well.         Disposition: PACU - hemodynamically stable.         Condition: stable   Hildred LaserAnika Zetta Stoneman, MD [facility] 04/29/2016 6:02 AM

## 2016-04-30 LAB — CBC
HCT: 29.8 % — ABNORMAL LOW (ref 35.0–47.0)
Hemoglobin: 10 g/dL — ABNORMAL LOW (ref 12.0–16.0)
MCH: 26.8 pg (ref 26.0–34.0)
MCHC: 33.7 g/dL (ref 32.0–36.0)
MCV: 79.5 fL — ABNORMAL LOW (ref 80.0–100.0)
PLATELETS: 203 10*3/uL (ref 150–440)
RBC: 3.75 MIL/uL — AB (ref 3.80–5.20)
RDW: 14.6 % — ABNORMAL HIGH (ref 11.5–14.5)
WBC: 12.5 10*3/uL — AB (ref 3.6–11.0)

## 2016-04-30 MED ORDER — LIDOCAINE 5 % EX PTCH
1.0000 | MEDICATED_PATCH | CUTANEOUS | Status: DC
  Administered 2016-04-30 – 2016-05-01 (×2): 1 via TRANSDERMAL
  Filled 2016-04-30 (×2): qty 1

## 2016-04-30 NOTE — Progress Notes (Signed)
Postpartum [place] # 1: Cesarean Delivery (repeat)  Subjective: Patient reports tolerating PO and no problems voiding.  Denies passage of flatus or BM. Ambulating without difficulty.   Objective: Vital signs in last 24 hours: Temp:  [97.8 F (36.6 C)-98.6 F (37 C)] 98.5 F (36.9 C) (03/03 0734) Pulse Rate:  [68-88] 88 (03/03 0734) Resp:  [14-20] 18 (03/03 0734) BP: (95-127)/(50-69) 114/69 (03/03 0734) SpO2:  [97 %-100 %] 100 % (03/03 0734)  Physical Exam:  General: alert and no distress Lungs: clear to auscultation bilaterally Breasts: normal appearance, no masses or tenderness Heart: regular rate and rhythm, S1, S2 normal, no murmur, click, rub or gallop.  Abdomen: bowel sounds present. No masses.  Pelvis: Lochia appropriate, Uterine Fundus firm, Incision: healing well, no significant drainage, no dehiscence, no significant erythema Extremities: DVT Evaluation: No evidence of DVT seen on physical exam. Negative Homan's sign. No cords or calf tenderness. No significant calf/ankle edema.   Recent Labs  04/28/16 1141 04/30/16 0415  HGB 12.5 10.0*  HCT 36.6 29.8*    Assessment/Plan: Status post Cesarean section. Doing well postoperatively.  Breastfeeding  Contraception: NuvaRing Regular diet Continue PO pain management Continue current care. Plan for discharge tomorrow  LOS: 2 days  Hildred LaserAnika Maryrose Colvin, MD [facility]

## 2016-04-30 NOTE — Anesthesia Postprocedure Evaluation (Signed)
Anesthesia Post Note  Patient: Whitney Velasquez  Procedure(s) Performed: Procedure(s) (LRB): CESAREAN SECTION (N/A)  Patient location during evaluation: Mother Baby Anesthesia Type: Spinal Level of consciousness: awake and alert and oriented Pain management: pain level controlled Vital Signs Assessment: post-procedure vital signs reviewed and stable Respiratory status: spontaneous breathing Cardiovascular status: stable Postop Assessment: no signs of nausea or vomiting and adequate PO intake Anesthetic complications: no     Last Vitals:  Vitals:   04/30/16 0011 04/30/16 0432  BP: (!) 116/57 (!) 101/50  Pulse: 68 77  Resp: 20 18  Temp:  36.8 C    Last Pain:  Vitals:   04/30/16 0636  TempSrc:   PainSc: 1                  Aadvika Konen,  Alessandra BevelsJennifer M

## 2016-04-30 NOTE — Lactation Note (Signed)
This note was copied from a baby's chart. Lactation Consultation Note  Patient Name: Girl Piedad ClimesVernetta Benton Today's Date: 04/30/2016 Reason for consult: Follow-up assessment Baby fussy at breast, gassy, latches pulls off repeatedly and cries  Maternal Data Has patient been taught Hand Expression?: Yes Does the patient have breastfeeding experience prior to this delivery?: Yes  Feeding Feeding Type: Breast Fed Length of feed: 5 min (few sucks then pulls off ) Arches back, can't coordinate suck, falls alseep when comforted LATCH Score/Interventions Latch: Repeated attempts needed to sustain latch, nipple held in mouth throughout feeding, stimulation needed to elicit sucking reflex. Intervention(s): Adjust position;Assist with latch;Breast massage;Breast compression  Audible Swallowing: A few with stimulation (2 swallows heard) Intervention(s): Hand expression  Type of Nipple: Everted at rest and after stimulation  Comfort (Breast/Nipple): Filling, red/small blisters or bruises, mild/mod discomfort  Problem noted: Mild/Moderate discomfort Interventions (Mild/moderate discomfort): Comfort gels (coconut oil, gel pads)  Hold (Positioning): Assistance needed to correctly position infant at breast and maintain latch.  LATCH Score: 6  Lactation Tools Discussed/Used WIC Program: No   Consult Status Consult Status: Follow-up Date: 04/30/16 Follow-up type: In-patient Mom to Try again 30 - 60 min   Dyann KiefMarsha D Fionna Merriott 04/30/2016, 12:01 PM

## 2016-04-30 NOTE — Anesthesia Post-op Follow-up Note (Signed)
  Anesthesia Pain Follow-up Note  Patient: Whitney Velasquez  [place] #: 1  Date of Follow-up: 04/30/2016 Time: 7:28 AM  Last Vitals:  Vitals:   04/30/16 0011 04/30/16 0432  BP: (!) 116/57 (!) 101/50  Pulse: 68 77  Resp: 20 18  Temp:  36.8 C    Level of Consciousness: alert  Pain: mild   Side Effects:None  Catheter Site Exam:clean     Plan: D/C from anesthesia care at surgeon's request  Rica MastBachich,  Cyrena Kuchenbecker M

## 2016-04-30 NOTE — Lactation Note (Signed)
This note was copied from a baby's chart. Lactation Consultation Note  Patient Name: Whitney Velasquez Today's Date: 04/30/2016 Reason for consult: Follow-up assessment   Maternal Data    Feeding Feeding Type: Breast Fed Length of feed: 15 min BAby less fussy at breast and staying at breast longer without pulling off, swallows heard   LATCH Score/Interventions Latch: Grasps breast easily, tongue down, lips flanged, rhythmical sucking.  Audible Swallowing: Spontaneous and intermittent  Type of Nipple: Everted at rest and after stimulation  Comfort (Breast/Nipple): Filling, red/small blisters or bruises, mild/mod discomfort     Hold (Positioning): No assistance needed to correctly position infant at breast.  LATCH Score: 9  Lactation Tools Discussed/Used Pump Review: Setup, frequency, and cleaning;Milk Storage Initiated by:: Cay SchillingsM Ciel Yanes RNC IBCLC Date initiated:: 04/30/16   Consult Status Consult Status: PRN Date: 05/01/16 Follow-up type: In-patient    Dyann KiefMarsha D Montia Haslip 04/30/2016, 7:09 PM

## 2016-05-01 MED ORDER — IBUPROFEN 600 MG PO TABS: 600.0000 mg | ORAL_TABLET | Freq: Four times a day (QID) | ORAL | 1 refills | Status: DC

## 2016-05-01 MED ORDER — DOCUSATE SODIUM 100 MG PO CAPS: 100.0000 mg | ORAL_CAPSULE | Freq: Two times a day (BID) | ORAL | 2 refills | Status: DC | PRN

## 2016-05-01 MED ORDER — OXYCODONE-ACETAMINOPHEN 5-325 MG PO TABS: 1.0000 | ORAL_TABLET | Freq: Four times a day (QID) | ORAL | 0 refills | Status: DC | PRN

## 2016-05-01 MED ORDER — FERROUS SULFATE 325 (65 FE) MG PO TABS: 325.0000 mg | ORAL_TABLET | Freq: Every day | ORAL | 1 refills | Status: DC

## 2016-05-01 NOTE — Progress Notes (Signed)
Patient discharged home at 1120.

## 2016-05-01 NOTE — Progress Notes (Signed)
Discharge instructions complete and prescriptions given. Patient verbalizes understanding of teaching. 

## 2016-05-01 NOTE — Discharge Summary (Signed)
Obstetric Discharge Summary Reason for Admission: rupture of membranes, h/o prior C-section desiring TOLAC Prenatal Procedures: ultrasound Intrapartum Procedures: cesarean: low cervical, transverse Postpartum Procedures: none Complications-Operative and Postpartum: none Hemoglobin  Date Value Ref Range Status  04/30/2016 10.0 (L) 12.0 - 16.0 g/dL Final   HCT  Date Value Ref Range Status  04/30/2016 29.8 (L) 35.0 - 47.0 % Final   Hematocrit  Date Value Ref Range Status  03/02/2016 36.3 34.0 - 46.6 % Final    Physical Exam:  Blood pressure 125/73, pulse 91, temperature 98.8 F (37.1 C), temperature source Oral, resp. rate 18, height 5\' 3"  (1.6 m), weight 225 lb (102.1 kg), last menstrual period 07/25/2015, SpO2 100 %, unknown if currently breastfeeding.  General: alert and no distress Lochia: appropriate Uterine Fundus: firm Incision: healing well, no significant drainage, no dehiscence, no significant erythema DVT Evaluation: No evidence of DVT seen on physical exam. Negative Homan's sign. No cords or calf tenderness. No significant calf/ankle edema.  Discharge Diagnoses: Term Pregnancy-delivered, H/o cesarean section   Discharge Information: Date: 05/01/2016 Activity: pelvic rest Diet: routine Medications: PNV, Ibuprofen, Colace, Iron and Percocet Condition: stable Instructions: refer to practice specific booklet Discharge to: home Follow-up Information    Hildred LaserAnika Dianah Pruett, MD Follow up in 1 week(s).   Specialties:  Obstetrics and Gynecology, Radiology Why:  Incision check Contact information: [address] [place] [id number] [phone]           Newborn Data: Live born female  Birth Weight: 7 lb 3.7 oz (3280 g) APGAR: 9, 9  Home with mother.  Hildred Lasernika Antrell Tipler 05/01/2016, 10:10 AM

## 2016-05-02 ENCOUNTER — Other Ambulatory Visit: Payer: BLUE CROSS/BLUE SHIELD

## 2016-05-03 ENCOUNTER — Encounter: Payer: BLUE CROSS/BLUE SHIELD | Admitting: Obstetrics and Gynecology

## 2016-05-04 ENCOUNTER — Encounter: Payer: BLUE CROSS/BLUE SHIELD | Admitting: Obstetrics and Gynecology

## 2016-05-11 ENCOUNTER — Encounter: Payer: Self-pay | Admitting: Obstetrics and Gynecology

## 2016-05-11 ENCOUNTER — Ambulatory Visit (INDEPENDENT_AMBULATORY_CARE_PROVIDER_SITE_OTHER): Payer: BLUE CROSS/BLUE SHIELD | Admitting: Obstetrics and Gynecology

## 2016-05-11 VITALS — BP 110/73 | HR 87 | Ht 63.0 in

## 2016-05-11 DIAGNOSIS — L2489 Irritant contact dermatitis due to other agents: Secondary | ICD-10-CM

## 2016-05-11 DIAGNOSIS — Z5189 Encounter for other specified aftercare: Secondary | ICD-10-CM

## 2016-05-11 DIAGNOSIS — Z98891 History of uterine scar from previous surgery: Secondary | ICD-10-CM

## 2016-05-11 NOTE — Progress Notes (Signed)
    OBSTETRICS/GYNECOLOGY POST-OPERATIVE CLINIC VISIT  Subjective:     Whitney Velasquez is a 36 y.o. [id number] female who presents to the clinic 1 weeks status post cesarean section (repeat) for non-reassuring fetal heart tracing after trial of labor. Eating a regular diet without difficulty. Bowel movements are normal. Pain is controlled with current analgesics. Medications being used: prescription NSAID's including ibuprofen (Motrin).  The following portions of the patient's history were reviewed and updated as appropriate: allergies, current medications, past family history, past medical history, past social history, past surgical history and problem list.  Review of Systems A comprehensive review of systems was negative except for: Allergic/Immunologic: positive for generalized itching around abdomen    Objective:    BP 110/73 (BP Location: Left Arm, Patient Position: Sitting, Cuff Size: Normal)   Pulse 87   Ht 5\' 3"  (1.6 m)   Breastfeeding? Yes  General:  alert and no distress  Abdomen: soft, bowel sounds active, non-tender.  Macular rash noted across lower abdomen  Incision:   healing well, no drainage,non-tender, no hernia, no seroma, no swelling, no warmth, no dehiscence, incision well approximated.  Mild erythema noted in skin surrounding incision but none at incision site    Lab Results  Component Value Date   WBC 12.5 (H) 04/30/2016   HGB 10.0 (L) 04/30/2016   HCT 29.8 (L) 04/30/2016   MCV 79.5 (L) 04/30/2016   PLT 203 04/30/2016    Assessment:    Doing well postoperatively. S/p repeat C-section.  Macular rash of abdomen (suspicious for contact dermatitis due to distribution)   Plan:   1. Continue any current medications. 2. Wound care discussed. 3. Advised on OTC hydrocortisone cream and Benadryl for rash.  4. Activity restrictions: no bending, stooping, or squatting, no lifting more than 15 pounds and pelvic rest 5. Anticipated return to work: 8 weeks. 6. Follow  up: 5 weeks for postpartum check.    Hildred LaserAnika Aldyn Toon, MD [facility]

## 2016-05-19 ENCOUNTER — Other Ambulatory Visit: Payer: Self-pay

## 2016-05-19 ENCOUNTER — Encounter: Payer: Self-pay | Admitting: Obstetrics and Gynecology

## 2016-05-19 DIAGNOSIS — N61 Mastitis without abscess: Secondary | ICD-10-CM

## 2016-05-19 MED ORDER — CLINDAMYCIN HCL 300 MG PO CAPS: 300.0000 mg | ORAL_CAPSULE | Freq: Three times a day (TID) | ORAL | 0 refills | Status: DC

## 2016-06-15 ENCOUNTER — Encounter: Payer: BLUE CROSS/BLUE SHIELD | Admitting: Obstetrics and Gynecology

## 2016-06-21 ENCOUNTER — Ambulatory Visit (INDEPENDENT_AMBULATORY_CARE_PROVIDER_SITE_OTHER): Payer: BLUE CROSS/BLUE SHIELD | Admitting: Obstetrics and Gynecology

## 2016-06-21 ENCOUNTER — Encounter: Payer: Self-pay | Admitting: Obstetrics and Gynecology

## 2016-06-21 NOTE — Progress Notes (Signed)
   OBSTETRICS POSTPARTUM CLINIC PROGRESS NOTE  Subjective:     Whitney Velasquez is a 36 y.o. [id number] female who presents for a postpartum visit. She is 8 weeks postpartum following a repeat low cervical transverse Cesarean section. I have fully reviewed the prenatal and intrapartum course. The delivery was at 39 gestational weeks.  Anesthesia: spinal. Postpartum course has been well. Baby's course has been well. Baby is feeding by breast. Bleeding: patient has not resumed menses, with No LMP recorded. Patient is not currently having periods (Reason: Lactating). Bowel function is normal. Bladder function is normal. Patient is sexually active (notes intercourse once last week, used condoms). Contraception method desired is condoms and Nuvaring. Postpartum depression screening: negative.  The following portions of the patient's history were reviewed and updated as appropriate: allergies, current medications, past family history, past medical history, past social history, past surgical history and problem list.  Review of Systems Pertinent items noted in HPI and remainder of comprehensive ROS otherwise negative.   Objective:    BP 105/66 (BP Location: Left Arm, Patient Position: Sitting, Cuff Size: Large)   Pulse 77   Ht  (1.6 m)   Wt 205 lb 8 oz (93.2 kg)   Breastfeeding? Yes   BMI 36.40 kg/m   General:  alert and no distress   Breasts:  inspection negative, no nipple discharge or bleeding, no masses or nodularity palpable  Lungs: clear to auscultation bilaterally  Heart:  regular rate and rhythm, S1, S2 normal, no murmur, click, rub or gallop  Abdomen: soft, non-tender; bowel sounds normal; no masses,  no organomegaly.  Well healed Pfannenstiel incision   Vulva:  normal  Vagina: normal vagina, no discharge, exudate, lesion, or erythema  Cervix:  no cervical motion tenderness and no lesions  Corpus: normal size, contour, position, consistency, mobility, non-tender  Adnexa:  normal adnexa  and no mass, fullness, tenderness  Rectal Exam: Not performed.         Labs:  Lab Results  Component Value Date   HGB 10.0 (L) 04/30/2016     Assessment:    Routine postpartum exam s/p cesarean section.   Breastfeeding   Plan:    1. Contraception: currently using condoms but desires NuvaRing.  After discussion of Nuvaring, patient notes that she will continue condoms for now as she does not want to risk getting a period while lactating.  Will begin NuvaRing after she weans from breastfeeding. 2. Can resume all normal activites.  Work letter provided today to return to work on Monday.  3. Follow up in: 6 months for annual exam, or as needed.    Hildred Laser, MD [facility]

## 2016-08-25 ENCOUNTER — Other Ambulatory Visit: Payer: Self-pay

## 2016-08-25 DIAGNOSIS — R52 Pain, unspecified: Secondary | ICD-10-CM

## 2016-08-25 MED ORDER — IBUPROFEN 600 MG PO TABS: 600.0000 mg | ORAL_TABLET | Freq: Four times a day (QID) | ORAL | 1 refills | Status: DC | PRN

## 2016-12-22 ENCOUNTER — Encounter: Payer: BLUE CROSS/BLUE SHIELD | Admitting: Obstetrics and Gynecology

## 2017-07-14 ENCOUNTER — Encounter: Payer: BLUE CROSS/BLUE SHIELD | Admitting: Obstetrics and Gynecology

## 2017-07-19 ENCOUNTER — Ambulatory Visit (INDEPENDENT_AMBULATORY_CARE_PROVIDER_SITE_OTHER): Payer: BC Managed Care – PPO | Admitting: Obstetrics and Gynecology

## 2017-07-19 ENCOUNTER — Encounter: Payer: Self-pay | Admitting: Obstetrics and Gynecology

## 2017-07-19 VITALS — BP 108/57 | HR 130 | Ht 63.0 in | Wt 172.3 lb

## 2017-07-19 DIAGNOSIS — N920 Excessive and frequent menstruation with regular cycle: Secondary | ICD-10-CM

## 2017-07-19 DIAGNOSIS — Z124 Encounter for screening for malignant neoplasm of cervix: Secondary | ICD-10-CM | POA: Diagnosis not present

## 2017-07-19 DIAGNOSIS — Z01419 Encounter for gynecological examination (general) (routine) without abnormal findings: Secondary | ICD-10-CM | POA: Diagnosis not present

## 2017-07-19 DIAGNOSIS — Z30015 Encounter for initial prescription of vaginal ring hormonal contraceptive: Secondary | ICD-10-CM

## 2017-07-19 MED ORDER — ETONOGESTREL-ETHINYL ESTRADIOL 0.12-0.015 MG/24HR VA RING: VAGINAL_RING | VAGINAL | 12 refills | Status: DC

## 2017-07-19 MED ORDER — DOCUSATE SODIUM 100 MG PO CAPS: 100.0000 mg | ORAL_CAPSULE | Freq: Two times a day (BID) | ORAL | 6 refills | Status: AC | PRN

## 2017-07-19 NOTE — Patient Instructions (Signed)

## 2017-07-19 NOTE — Progress Notes (Signed)
GYNECOLOGY ANNUAL PHYSICAL EXAM PROGRESS NOTE  Subjective:    Whitney Velasquez is a 37 y.o. [id number] female who presents for an annual exam. The patient is sexually active.  The patient wears seatbelts: yes. The patient participates in regular exercise: no. Has the patient ever been transfused or tattooed?: no. The patient reports that there is not domestic violence in her life.   The patient has the following complaints today:  1. Notes that her last several menstrual cycles have become very heavy, lasting 5-7 days, with moderate dysmenorrhea.   Gynecologic History Patient's last menstrual period was 07/07/2017. Menstrual History: OB History    Gravida  5   Para  3   Term  3   Preterm      AB  2   Living  4     SAB  1   TAB      Ectopic      Multiple  1   Live Births  4        Obstetric Comments  2000-failure to progress 2004 twins-female and female        Menarche age: 68 Patient's last menstrual period was 07/07/2017. Contraception: condoms History of STI's: Denies Last Pap: 2-3 years ago. Results were: normal.  Denies h/o abnormal pap smears. Patient has never had a mammogram.    OB History  Gravida Para Term Preterm AB Living  0 2 4  SAB TAB Ectopic Multiple Live Births  1 0 0 1 4    # Outcome Date GA Lbr Len/2nd Weight Sex Delivery Anes PTL Lv  5 Term 04/29/16 [redacted]w[redacted]d  7 lb 3.7 oz (3.28 kg) F CS-LTranv Spinal  LIV     Name: Neddo,GIRL Cedar     Apgar1: 9  Apgar5: 9  4 SAB 04/2015        FD  3A Term 2004 [redacted]w[redacted]d  4 lb 9 oz (2.07 kg) F Vag-Spont  N LIV  3B Term 2004 [redacted]w[redacted]d  4 lb 4 oz (1.928 kg) M Vag-Spont  N LIV  2 Term 2000 [redacted]w[redacted]d  8 lb 2.6 oz (3.702 kg) M CS-Unspec  N LIV     Complications: Failure to Progress in First Stage  1 AB 1998        FD    Obstetric Comments  2000-failure to progress  2004 twins-female and female    Past Medical History:  Diagnosis Date  . Asthma    Last asthma exacerbation requiring hospitalization was in  2013, no intubations.  . Vaginal Pap smear, abnormal    was scheduled for colpo but found out she was pregnant    Past Surgical History:  Procedure Laterality Date  . CESAREAN SECTION    . CESAREAN SECTION N/A 04/29/2016   Procedure: CESAREAN SECTION;  Surgeon: Hildred Laser, MD;  Location: [facility] ORS;  Service: Obstetrics;  Laterality: N/A;    Family History  Problem Relation Age of Onset  . Fibroids Mother   . Diabetes Maternal Grandmother   . Heart disease Maternal Grandmother        CAD  . Cancer Paternal Grandfather   . Rashes / Skin problems Son        eczema  . Rashes / Skin problems Daughter        eczema    Social History   Socioeconomic History  . Marital status: Married    Spouse name: Not on file  . Number of children: Not on file  .  Years of education: Not on file  . Highest education level: Not on file  Occupational History  . Occupation: fiancial    Comment: [employer]  Social Needs  . Financial resource strain: Not on file  . Food insecurity:    Worry: Not on file    Inability: Not on file  . Transportation needs:    Medical: Not on file    Non-medical: Not on file  Tobacco Use  . Smoking status: Never Smoker  . Smokeless tobacco: Never Used  Substance and Sexual Activity  . Alcohol use: Yes    Alcohol/week: 0.0 oz  . Drug use: No  . Sexual activity: Yes    Partners: Male  Lifestyle  . Physical activity:    Days per week: Not on file    Minutes per session: Not on file  . Stress: Not on file  Relationships  . Social connections:    Talks on phone: Not on file    Gets together: Not on file    Attends religious service: Not on file    Active member of club or organization: Not on file    Attends meetings of clubs or organizations: Not on file    Relationship status: Not on file  . Intimate partner violence:    Fear of current or ex partner: Not on file    Emotionally abused: Not on file    Physically abused: Not on file     Forced sexual activity: Not on file  Other Topics Concern  . Not on file  Social History Narrative  . Not on file    Current Outpatient Medications on File Prior to Visit  Medication Sig Dispense Refill  . albuterol (PROVENTIL HFA;VENTOLIN HFA) 108 (90 Base) MCG/ACT inhaler Inhale 2 puffs into the lungs every 6 (six) hours as needed.    . fluticasone-salmeterol (ADVAIR HFA) 115-21 MCG/ACT inhaler Inhale 2 puffs into the lungs 2 (two) times daily as needed.     No current facility-administered medications on file prior to visit.     Allergies  Allergen Reactions  . Penicillin G Other (See Comments)    unknown     Review of Systems Constitutional: negative for chills, fatigue, fevers and sweats Eyes: negative for irritation, redness and visual disturbance Ears, nose, mouth, throat, and face: negative for hearing loss, nasal congestion, snoring and tinnitus Respiratory: negative for asthma, cough, sputum Cardiovascular: negative for chest pain, dyspnea, exertional chest pressure/discomfort, irregular heart beat, palpitations and syncope Gastrointestinal: negative for abdominal pain, change in bowel habits, nausea and vomiting Genitourinary: positive for abnormal menstrual periods. Negative for genital lesions, sexual problems and vaginal discharge, dysuria and urinary incontinence Integument/breast: negative for breast lump, breast tenderness and nipple discharge Hematologic/lymphatic: negative for bleeding and easy bruising Musculoskeletal:negative for back pain and muscle weakness Neurological: negative for dizziness, headaches, vertigo and weakness Endocrine: negative for diabetic symptoms including polydipsia, polyuria and skin dryness Allergic/Immunologic: negative for hay fever and urticaria        Objective:  Blood pressure (!) 108/57, pulse (!) 130, height  (1.6 m), weight 172 lb 4.8 oz (78.2 kg), last menstrual period 07/07/2017, currently breastfeeding. Body mass  index is 30.52 kg/m.  Repeat pulse 110  General Appearance:    Alert, cooperative, no distress, appears stated age, mildly obese  Head:    Normocephalic, without obvious abnormality, atraumatic  Eyes:    PERRL, conjunctiva/corneas clear, EOM's intact, both eyes  Ears:    Normal external ear canals,  both ears  Nose:   Nares normal, septum midline, mucosa normal, no drainage or sinus tenderness  Throat:   Lips, mucosa, and tongue normal; teeth and gums normal  Neck:   Supple, symmetrical, trachea midline, no adenopathy; thyroid: no enlargement/tenderness/nodules; no carotid bruit or JVD  Back:     Symmetric, no curvature, ROM normal, no CVA tenderness  Lungs:     Clear to auscultation bilaterally, respirations unlabored  Chest Wall:    No tenderness or deformity   Heart:    Regular rate and rhythm, S1 and S2 normal, no murmur, rub or gallop  Breast Exam:    No tenderness, masses, or nipple abnormality  Abdomen:     Soft, non-tender, bowel sounds active all four quadrants, no masses, no organomegaly.    Genitalia:    Pelvic:external genitalia normal, vagina without lesions, discharge, or tenderness, rectovaginal septum  normal. Cervix normal in appearance, no cervical motion tenderness, no adnexal masses or tenderness.  Uterus normal size, shape, mobile, regular contours, nontender.  Rectal:    Normal external sphincter.  No hemorrhoids appreciated. Internal exam not done.   Extremities:   Extremities normal, atraumatic, no cyanosis or edema  Pulses:   2+ and symmetric all extremities  Skin:   Skin color, texture, turgor normal, no rashes or lesions  Lymph nodes:   Cervical, supraclavicular, and axillary nodes normal  Neurologic:   CNII-XII intact, normal strength, sensation and reflexes throughout   .  Labs:  Lab Results  Component Value Date   WBC 12.5 (H) 04/30/2016   HGB 10.0 (L) 04/30/2016   HCT 29.8 (L) 04/30/2016   MCV 79.5 (L) 04/30/2016   PLT 203 04/30/2016    No results  found for: CREATININE, BUN, NA, K, CL, CO2  No results found for: ALT, AST, GGT, ALKPHOS, BILITOT  Lab Results  Component Value Date   TSH 0.831 09/10/2015     Assessment:   Healthy female exam.  Menorrhagia  Mildly obese   Tachycardia   Plan:    Blood tests: CBC with diff, Comprehensive metabolic panel and TSH.  Breast self exam technique reviewed and patient encouraged to perform self-exam monthly. Contraception: condoms currently, but patient would like to resume use of NuvaRing to help with menstrual cycles. Advised on Sunday start after next menses.  Discussed healthy lifestyle modifications. Pap smear performed today.  Tachycardia, asymptomatic. No prior history. TSH and CBC ordered for abnormal bleeding as well.    Hildred Laser, MD [facility]

## 2017-07-19 NOTE — Progress Notes (Signed)
Pt is present today for her annual exam. Pt stated that she is having heavy cycles after having her baby. Using super size pads and tampons together and having to change every 2 hours. Lasting 5-7 days the second [place] of cycle its really heavy. Wants to discuss birth control.

## 2017-07-20 LAB — CBC
HEMATOCRIT: 38.7 % (ref 34.0–46.6)
HEMOGLOBIN: 12.9 g/dL (ref 11.1–15.9)
MCH: 28.9 pg (ref 26.6–33.0)
MCHC: 33.3 g/dL (ref 31.5–35.7)
MCV: 87 fL (ref 79–97)
Platelets: 305 10*3/uL (ref 150–450)
RBC: 4.47 x10E6/uL (ref 3.77–5.28)
RDW: 13.8 % (ref 12.3–15.4)
WBC: 7.7 10*3/uL (ref 3.4–10.8)

## 2017-07-20 LAB — COMPREHENSIVE METABOLIC PANEL
ALBUMIN: 4.3 g/dL (ref 3.5–5.5)
ALK PHOS: 49 IU/L (ref 39–117)
ALT: 11 IU/L (ref 0–32)
AST: 15 IU/L (ref 0–40)
Albumin/Globulin Ratio: 1.7 (ref 1.2–2.2)
BUN/Creatinine Ratio: 17 (ref 9–23)
BUN: 10 mg/dL (ref 6–20)
Bilirubin Total: 0.5 mg/dL (ref 0.0–1.2)
CALCIUM: 9.2 mg/dL (ref 8.7–10.2)
CO2: 23 mmol/L (ref 20–29)
CREATININE: 0.6 mg/dL (ref 0.57–1.00)
Chloride: 102 mmol/L (ref 96–106)
GFR calc Af Amer: 134 mL/min/{1.73_m2} (ref >59)
GFR, EST NON AFRICAN AMERICAN: 116 mL/min/{1.73_m2} (ref >59)
GLUCOSE: 80 mg/dL (ref 65–99)
Globulin, Total: 2.6 g/dL (ref 1.5–4.5)
Potassium: 4.1 mmol/L (ref 3.5–5.2)
Sodium: 138 mmol/L (ref 134–144)
Total Protein: 6.9 g/dL (ref 6.0–8.5)

## 2017-07-20 LAB — TSH: TSH: 0.737 u[IU]/mL (ref 0.450–4.500)

## 2017-07-21 LAB — PAP IG, CT-NG, RFX HPV ASCU
Chlamydia, Nuc. Acid Amp: NEGATIVE
GONOCOCCUS BY NUCLEIC ACID AMP: NEGATIVE
PAP Smear Comment: 0

## 2018-08-13 ENCOUNTER — Other Ambulatory Visit: Payer: Self-pay | Admitting: Obstetrics and Gynecology

## 2018-10-10 ENCOUNTER — Other Ambulatory Visit: Payer: Self-pay | Admitting: Obstetrics and Gynecology

## 2018-12-09 ENCOUNTER — Other Ambulatory Visit: Payer: Self-pay | Admitting: Obstetrics and Gynecology

## 2018-12-10 ENCOUNTER — Telehealth: Payer: Self-pay | Admitting: Obstetrics and Gynecology

## 2018-12-10 NOTE — Telephone Encounter (Signed)
The patient called and stated that she needs a one time refill of her birth control nuva ring sen to her pharmacy before her next apt. Pt stated she will run out/does not have a refill. Please advise.

## 2018-12-11 MED ORDER — ETONOGESTREL-ETHINYL ESTRADIOL 0.12-0.015 MG/24HR VA RING: VAGINAL_RING | VAGINAL | 1 refills | Status: DC

## 2018-12-11 NOTE — Telephone Encounter (Signed)
Pt called and informed that her medication will be refilled and sent to her pharmacy [facility] in [place].

## 2018-12-17 NOTE — Progress Notes (Signed)
Pt is present today due to having side effects of the Nuva Ring.

## 2018-12-18 ENCOUNTER — Other Ambulatory Visit: Payer: Self-pay

## 2018-12-18 ENCOUNTER — Encounter: Payer: BC Managed Care – PPO | Admitting: Obstetrics and Gynecology

## 2018-12-19 ENCOUNTER — Encounter: Payer: Self-pay | Admitting: Obstetrics and Gynecology

## 2018-12-19 NOTE — Progress Notes (Signed)
Patient left before being seen by provider as she thought she was scheduled for an annual exam instead of a problem visit. Notes she will reschedule her appointment.   Rubie Maid, MD [facility]

## 2019-02-06 ENCOUNTER — Telehealth: Payer: Self-pay | Admitting: Obstetrics and Gynecology

## 2019-02-06 NOTE — Telephone Encounter (Signed)
The patient called and stated that she is going to need a extended prescription refill of her birth control. The pt had to reschedule apt due to being in contact with someone who has covid-19. Please advise.

## 2019-02-07 ENCOUNTER — Encounter: Payer: BC Managed Care – PPO | Admitting: Obstetrics and Gynecology

## 2019-02-08 MED ORDER — ETONOGESTREL-ETHINYL ESTRADIOL 0.12-0.015 MG/24HR VA RING: VAGINAL_RING | VAGINAL | 1 refills | Status: DC

## 2019-02-08 NOTE — Telephone Encounter (Signed)
Pt is aware that her medication has been refill and sent to her pharmacy.

## 2019-03-20 ENCOUNTER — Encounter: Payer: BC Managed Care – PPO | Admitting: Obstetrics and Gynecology

## 2019-03-21 ENCOUNTER — Ambulatory Visit: Payer: BC Managed Care – PPO | Attending: Internal Medicine

## 2019-03-21 DIAGNOSIS — Z20822 Contact with and (suspected) exposure to covid-19: Secondary | ICD-10-CM

## 2019-03-22 LAB — NOVEL CORONAVIRUS, NAA: SARS-CoV-2, NAA: NOT DETECTED

## 2019-04-09 ENCOUNTER — Other Ambulatory Visit: Payer: Self-pay | Admitting: Obstetrics and Gynecology

## 2019-04-10 NOTE — Telephone Encounter (Signed)
Pt called to schedule an annual exam and medication refill. Pt scheduled and appointment set.

## 2019-04-22 ENCOUNTER — Other Ambulatory Visit: Payer: Self-pay | Admitting: Obstetrics and Gynecology

## 2019-05-06 ENCOUNTER — Ambulatory Visit: Payer: BC Managed Care – PPO | Attending: Internal Medicine

## 2019-05-06 DIAGNOSIS — Z23 Encounter for immunization: Secondary | ICD-10-CM | POA: Insufficient documentation

## 2019-05-06 NOTE — Progress Notes (Signed)
   Covid-19 Vaccination Clinic  Name:  Whitney Velasquez    MRN: [id number] DOB: 04-28-1980  05/06/2019  Whitney Velasquez was observed post Covid-19 immunization for 15 minutes without incident. She was provided with Vaccine Information Sheet and instruction to access the V-Safe system.   Whitney Velasquez was instructed to call 911 with any severe reactions post vaccine: Marland Kitchen Difficulty breathing  . Swelling of face and throat  . A fast heartbeat  . A bad rash all over body  . Dizziness and weakness   Immunizations Administered    Name Date Dose VIS Date Route   Pfizer COVID-19 Vaccine 05/06/2019  8:34 AM 0.3 mL 02/08/2019 Intramuscular   Manufacturer: [employer], [employer]   Lot: [id number]   NDC: [address]-1

## 2019-05-28 ENCOUNTER — Ambulatory Visit: Payer: BC Managed Care – PPO | Attending: Internal Medicine

## 2019-05-28 DIAGNOSIS — Z23 Encounter for immunization: Secondary | ICD-10-CM

## 2019-05-28 NOTE — Progress Notes (Signed)
   Covid-19 Vaccination Clinic  Name:  Whitney Velasquez    MRN: [id number] DOB: 10-29-1980  05/28/2019  Ms. Whitney Velasquez was observed post Covid-19 immunization for 15 minutes without incident. She was provided with Vaccine Information Sheet and instruction to access the V-Safe system.   Ms. Whitney Velasquez was instructed to call 911 with any severe reactions post vaccine: Marland Kitchen Difficulty breathing  . Swelling of face and throat  . A fast heartbeat  . A bad rash all over body  . Dizziness and weakness   Immunizations Administered    Name Date Dose VIS Date Route   Pfizer COVID-19 Vaccine 05/28/2019 10:18 AM 0.3 mL 02/08/2019 Intramuscular   Manufacturer: [employer], [employer]   Lot: [id number]   NDC: [address]-1

## 2019-06-16 ENCOUNTER — Other Ambulatory Visit: Payer: Self-pay | Admitting: Obstetrics and Gynecology

## 2019-06-28 ENCOUNTER — Ambulatory Visit (INDEPENDENT_AMBULATORY_CARE_PROVIDER_SITE_OTHER): Payer: BC Managed Care – PPO | Admitting: Obstetrics and Gynecology

## 2019-06-28 ENCOUNTER — Other Ambulatory Visit: Payer: Self-pay

## 2019-06-28 ENCOUNTER — Encounter: Payer: Self-pay | Admitting: Obstetrics and Gynecology

## 2019-06-28 VITALS — BP 117/75 | HR 74 | Ht 63.0 in | Wt 166.2 lb

## 2019-06-28 DIAGNOSIS — Z1231 Encounter for screening mammogram for malignant neoplasm of breast: Secondary | ICD-10-CM | POA: Diagnosis not present

## 2019-06-28 DIAGNOSIS — Z01419 Encounter for gynecological examination (general) (routine) without abnormal findings: Secondary | ICD-10-CM

## 2019-06-28 DIAGNOSIS — Z13220 Encounter for screening for lipoid disorders: Secondary | ICD-10-CM | POA: Diagnosis not present

## 2019-06-28 DIAGNOSIS — E663 Overweight: Secondary | ICD-10-CM | POA: Diagnosis not present

## 2019-06-28 MED ORDER — ETONOGESTREL-ETHINYL ESTRADIOL 0.12-0.015 MG/24HR VA RING: VAGINAL_RING | VAGINAL | 11 refills | Status: DC

## 2019-06-28 NOTE — Progress Notes (Signed)
Pt present for annual exam. Pt stated that she was doing well and denies any issues at this time. Refilled rx for nuvaring. Mammogram order placed.

## 2019-06-28 NOTE — Progress Notes (Signed)
GYNECOLOGY ANNUAL PHYSICAL EXAM PROGRESS NOTE  Subjective:    Whitney Velasquez is a 39 y.o. [phone] female who presents for an annual exam. The patient is sexually active.  The patient wears seatbelts: yes. The patient participates in regular exercise: no. Has the patient ever been transfused or tattooed?: no. The patient reports that there is not domestic violence in her life.   The patient has the following complaints today:  1. Has had several COVID exposures, however never tested positive for COVID.   Gynecologic History Patient's last menstrual period was 06/20/2019. Menarche age: 26.  Cycles currently regular, moderate flow with small clots. Lasting 4-5 days.  Contraception: NuvaRing vaginal inserts History of STI's: Denies Last Pap: 07/19/2017.  Results were: normal.  Denies h/o abnormal pap smears. Patient has never had a mammogram.    OB History  Gravida Para Term Preterm AB Living  5 3 3  0 2 4  SAB TAB Ectopic Multiple Live Births  1 0 0 1 4    # Outcome Date GA Lbr Len/2nd Weight Sex Delivery Anes PTL Lv  5 Term 04/29/16 [id number]  7 lb 3.7 oz (3.28 kg) F CS-LTranv Spinal  LIV     Name: Pettibone,GIRL Roshell     Apgar1: 9  Apgar5: 9  4 SAB 04/2015        FD  3A Term 2004 [redacted]w[redacted]d  4 lb 9 oz (2.07 kg) F Vag-Spont  N LIV  3B Term 2004 [redacted]w[redacted]d  4 lb 4 oz (1.928 kg) M Vag-Spont  N LIV  2 Term 2000 [redacted]w[redacted]d  8 lb 2.6 oz (3.702 kg) M CS-Unspec  N LIV     Complications: Failure to Progress in First Stage  1 AB 1998        FD    Obstetric Comments  2000-failure to progress  2004 twins-female and female    Past Medical History:  Diagnosis Date  . Asthma    Last asthma exacerbation requiring hospitalization was in 2013, no intubations.  . Vaginal Pap smear, abnormal    was scheduled for colpo but found out she was pregnant    Past Surgical History:  Procedure Laterality Date  . CESAREAN SECTION    . CESAREAN SECTION N/A 04/29/2016   Procedure: CESAREAN SECTION;  Surgeon: Rubie Maid, MD;  Location: [facility] ORS;  Service: Obstetrics;  Laterality: N/A;    Family History  Problem Relation Age of Onset  . Fibroids Mother   . Diabetes Maternal Grandmother   . Heart disease Maternal Grandmother        CAD  . Cancer Paternal Grandfather   . Rashes / Skin problems Son        eczema  . Rashes / Skin problems Daughter        eczema    Social History   Socioeconomic History  . Marital status: Married    Spouse name: Not on file  . Number of children: Not on file  . Years of education: Not on file  . Highest education level: Not on file  Occupational History  . Occupation: fiancial    Comment: [employer]  Tobacco Use  . Smoking status: Never Smoker  . Smokeless tobacco: Never Used  Substance and Sexual Activity  . Alcohol use: Yes    Alcohol/week: 0.0 standard drinks  . Drug use: No  . Sexual activity: Yes    Partners: Male    Comment: nuvaring  Other Topics Concern  . Not on  file  Social History Narrative  . Not on file   Social Determinants of Health   Financial Resource Strain:   . Difficulty of Paying Living Expenses:   Food Insecurity:   . Worried About Programme researcher, broadcasting/film/video in the Last Year:   . Barista in the Last Year:   Transportation Needs:   . Freight forwarder (Medical):   Marland Kitchen Lack of Transportation (Non-Medical):   Physical Activity:   . Days of Exercise per Week:   . Minutes of Exercise per Session:   Stress:   . Feeling of Stress :   Social Connections:   . Frequency of Communication with Friends and Family:   . Frequency of Social Gatherings with Friends and Family:   . Attends Religious Services:   . Active Member of Clubs or Organizations:   . Attends Banker Meetings:   Marland Kitchen Marital Status:   Intimate Partner Violence:   . Fear of Current or Ex-Partner:   . Emotionally Abused:   Marland Kitchen Physically Abused:   . Sexually Abused:     Current Outpatient Medications on File Prior to Visit    Medication Sig Dispense Refill  . albuterol (PROVENTIL HFA;VENTOLIN HFA) 108 (90 Base) MCG/ACT inhaler Inhale 2 puffs into the lungs every 6 (six) hours as needed.    . docusate sodium (COLACE) 100 MG capsule Take 1 capsule (100 mg total) by mouth 2 (two) times daily as needed for mild constipation. 60 capsule 6  . etonogestrel-ethinyl estradiol (NUVARING) 0.12-0.015 MG/24HR vaginal ring INSERT 1 RING VAGINALLY AS DIRECTED. REMOVE AFTER 3 WEEKS & WAIT 7 DAYS BEFORE INSERTING A NEW RING 1 each 0  . fluticasone-salmeterol (ADVAIR HFA) 115-21 MCG/ACT inhaler Inhale 2 puffs into the lungs 2 (two) times daily as needed.     No current facility-administered medications on file prior to visit.    Allergies  Allergen Reactions  . Penicillin G Other (See Comments)    unknown     Review of Systems Constitutional: negative for chills, fatigue, fevers and sweats Eyes: negative for irritation, redness and visual disturbance Ears, nose, mouth, throat, and face: negative for hearing loss, nasal congestion, snoring and tinnitus Respiratory: negative for asthma, cough, sputum Cardiovascular: negative for chest pain, dyspnea, exertional chest pressure/discomfort, irregular heart beat, palpitations and syncope Gastrointestinal: negative for abdominal pain, change in bowel habits, nausea and vomiting Genitourinary: negative for abnormal menstrual periods, genital lesions, sexual problems and vaginal discharge, dysuria and urinary incontinence Integument/breast: negative for breast lump, breast tenderness and nipple discharge Hematologic/lymphatic: negative for bleeding and easy bruising Musculoskeletal:negative for back pain and muscle weakness Neurological: negative for dizziness, headaches, vertigo and weakness Endocrine: negative for diabetic symptoms including polydipsia, polyuria and skin dryness Allergic/Immunologic: negative for hay fever and urticaria        Objective:  Blood pressure 117/75,  pulse 74, height 5\' 3"  (1.6 m), weight 166 lb 3.2 oz (75.4 kg), last menstrual period 06/20/2019, currently breastfeeding. Body mass index is 29.44 kg/m.    General Appearance:    Alert, cooperative, no distress, appears stated age, overweight   Head:    Normocephalic, without obvious abnormality, atraumatic  Eyes:    PERRL, conjunctiva/corneas clear, EOM's intact, both eyes  Ears:    Normal external ear canals, both ears  Nose:   Nares normal, septum midline, mucosa normal, no drainage or sinus tenderness  Throat:   Lips, mucosa, and tongue normal; teeth and gums normal  Neck:   Supple,  symmetrical, trachea midline, no adenopathy; thyroid: no enlargement/tenderness/nodules; no carotid bruit or JVD  Back:     Symmetric, no curvature, ROM normal, no CVA tenderness  Lungs:     Clear to auscultation bilaterally, respirations unlabored  Chest Wall:    No tenderness or deformity   Heart:    Regular rate and rhythm, S1 and S2 normal, no murmur, rub or gallop  Breast Exam:    No tenderness, masses, or nipple abnormality  Abdomen:     Soft, non-tender, bowel sounds active all four quadrants, no masses, no organomegaly.    Genitalia:    Pelvic:external genitalia normal, vagina without lesions, discharge, or tenderness, Nuvaring in place.  Rectovaginal septum  normal. Cervix normal in appearance, no cervical motion tenderness, no adnexal masses or tenderness.  Uterus normal size, shape, mobile, regular contours, nontender.  Rectal:    Normal external sphincter.  No hemorrhoids appreciated. Internal exam not done.   Extremities:   Extremities normal, atraumatic, no cyanosis or edema  Pulses:   2+ and symmetric all extremities  Skin:   Skin color, texture, turgor normal, no rashes or lesions  Lymph nodes:   Cervical, supraclavicular, and axillary nodes normal  Neurologic:   CNII-XII intact, normal strength, sensation and reflexes throughout   .  Labs:  Lab Results  Component Value Date   WBC 7.7  07/19/2017   HGB 12.9 07/19/2017   HCT 38.7 07/19/2017   MCV 87 07/19/2017   PLT 305 07/19/2017    Lab Results  Component Value Date   CREATININE 0.60 07/19/2017   BUN 10 07/19/2017   NA 138 07/19/2017   K 4.1 07/19/2017   CL 102 07/19/2017   CO2 23 07/19/2017    Lab Results  Component Value Date   ALT 11 07/19/2017   AST 15 07/19/2017   ALKPHOS 49 07/19/2017   BILITOT 0.5 07/19/2017    Lab Results  Component Value Date   TSH 0.737 07/19/2017     Assessment:   1. Encounter for well woman exam with routine gynecological exam   2. Breast cancer screening by mammogram   3. Screening for lipid disorders   4. Overweight (BMI 25.0-29.9)    Plan:    Blood tests: CBC with diff, Comprehensive metabolic panel and Lipid panel.  Breast self exam technique reviewed and patient encouraged to perform self-exam monthly. Contraception:NuvaRing Discussed healthy lifestyle modifications. Pap smear up to date.  Due in 1 year.  Follow up in 1 year for annual exam.    Hildred Laser, MD [facility]

## 2019-06-28 NOTE — Patient Instructions (Signed)
Health Maintenance, Female Adopting a healthy lifestyle and getting preventive care are important in promoting health and wellness. Ask your health care provider about:  The right schedule for you to have regular tests and exams.  Things you can do on your own to prevent diseases and keep yourself healthy. What should I know about diet, weight, and exercise? Eat a healthy diet   Eat a diet that includes plenty of vegetables, fruits, low-fat dairy products, and lean protein.  Do not eat a lot of foods that are high in solid fats, added sugars, or sodium. Maintain a healthy weight Body mass index (BMI) is used to identify weight problems. It estimates body fat based on height and weight. Your health care provider can help determine your BMI and help you achieve or maintain a healthy weight. Get regular exercise Get regular exercise. This is one of the most important things you can do for your health. Most adults should:  Exercise for at least 150 minutes each week. The exercise should increase your heart rate and make you sweat (moderate-intensity exercise).  Do strengthening exercises at least twice a week. This is in addition to the moderate-intensity exercise.  Spend less time sitting. Even light physical activity can be beneficial. Watch cholesterol and blood lipids Have your blood tested for lipids and cholesterol at 40 years of age, then have this test every 5 years. Have your cholesterol levels checked more often if:  Your lipid or cholesterol levels are high.  You are older than 40 years of age.  You are at high risk for heart disease. What should I know about cancer screening? Depending on your health history and family history, you may need to have cancer screening at various ages. This may include screening for:  Breast cancer.  Cervical cancer.  Colorectal cancer.  Skin cancer.  Lung cancer. What should I know about heart disease, diabetes, and high blood  pressure? Blood pressure and heart disease  High blood pressure causes heart disease and increases the risk of stroke. This is more likely to develop in people who have high blood pressure readings, are of African descent, or are overweight.  Have your blood pressure checked: ? Every 3-5 years if you are 18-39 years of age. ? Every year if you are 40 years old or older. Diabetes Have regular diabetes screenings. This checks your fasting blood sugar level. Have the screening done:  Once every three years after age 40 if you are at a normal weight and have a low risk for diabetes.  More often and at a younger age if you are overweight or have a high risk for diabetes. What should I know about preventing infection? Hepatitis B If you have a higher risk for hepatitis B, you should be screened for this virus. Talk with your health care provider to find out if you are at risk for hepatitis B infection. Hepatitis C Testing is recommended for:  Everyone born from 1945 through 1965.  Anyone with known risk factors for hepatitis C. Sexually transmitted infections (STIs)  Get screened for STIs, including gonorrhea and chlamydia, if: ? You are sexually active and are younger than 40 years of age. ? You are older than 40 years of age and your health care provider tells you that you are at risk for this type of infection. ? Your sexual activity has changed since you were last screened, and you are at increased risk for chlamydia or gonorrhea. Ask your health care provider if   you are at risk.  Ask your health care provider about whether you are at high risk for HIV. Your health care provider may recommend a prescription medicine to help prevent HIV infection. If you choose to take medicine to prevent HIV, you should first get tested for HIV. You should then be tested every 3 months for as long as you are taking the medicine. Pregnancy  If you are about to stop having your period (premenopausal) and  you may become pregnant, seek counseling before you get pregnant.  Take 400 to 800 micrograms (mcg) of folic acid every [place] if you become pregnant.  Ask for birth control (contraception) if you want to prevent pregnancy. Osteoporosis and menopause Osteoporosis is a disease in which the bones lose minerals and strength with aging. This can result in bone fractures. If you are 65 years old or older, or if you are at risk for osteoporosis and fractures, ask your health care provider if you should:  Be screened for bone loss.  Take a calcium or vitamin D supplement to lower your risk of fractures.  Be given hormone replacement therapy (HRT) to treat symptoms of menopause. Follow these instructions at home: Lifestyle  Do not use any products that contain nicotine or tobacco, such as cigarettes, e-cigarettes, and chewing tobacco. If you need help quitting, ask your health care provider.  Do not use street drugs.  Do not share needles.  Ask your health care provider for help if you need support or information about quitting drugs. Alcohol use  Do not drink alcohol if: ? Your health care provider tells you not to drink. ? You are pregnant, may be pregnant, or are planning to become pregnant.  If you drink alcohol: ? Limit how much you use to 0-1 drink a [place]. ? Limit intake if you are breastfeeding.  Be aware of how much alcohol is in your drink. In the [place] one drink equals one 12 oz bottle of beer (355 mL), one 5 oz glass of wine (148 mL), or one 1 oz glass of hard liquor (44 mL). General instructions  Schedule regular health, dental, and eye exams.  Stay current with your vaccines.  Tell your health care provider if: ? You often feel depressed. ? You have ever been abused or do not feel safe at home. Summary  Adopting a healthy lifestyle and getting preventive care are important in promoting health and wellness.  Follow your health care provider's instructions about healthy  diet, exercising, and getting tested or screened for diseases.  Follow your health care provider's instructions on monitoring your cholesterol and blood pressure. This information is not intended to replace advice given to you by your health care provider. Make sure you discuss any questions you have with your health care provider. Document Revised: 02/07/2018 Document Reviewed: 02/07/2018 Elsevier Patient Education  2020 Elsevier Inc.    Breast Self-Awareness Breast self-awareness means being familiar with how your breasts look and feel. It involves checking your breasts regularly and reporting any changes to your health care provider. Practicing breast self-awareness is important. Sometimes changes may not be harmful (are benign), but sometimes a change in your breasts can be a sign of a serious medical problem. It is important to learn how to do this procedure correctly so that you can catch problems early, when treatment is more likely to be successful. All women should practice breast self-awareness, including women who have had breast implants. What you need:  A mirror.  A well-lit   room. How to do a breast self-exam A breast self-exam is one way to learn what is normal for your breasts and whether your breasts are changing. To do a breast self-exam: Look for changes  1. Remove all the clothing above your waist. 2. Stand in front of a mirror in a room with good lighting. 3. Put your hands on your hips. 4. Push your hands firmly downward. 5. Compare your breasts in the mirror. Look for differences between them (asymmetry), such as: ? Differences in shape. ? Differences in size. ? Puckers, dips, and bumps in one breast and not the other. 6. Look at each breast for changes in the skin, such as: ? Redness. ? Scaly areas. 7. Look for changes in your nipples, such as: ? Discharge. ? Bleeding. ? Dimpling. ? Redness. ? A change in position. Feel for changes Carefully feel your  breasts for lumps and changes. It is best to do this while lying on your back on the floor, and again while sitting or standing in the tub or shower with soapy water on your skin. Feel each breast in the following way: 1. Place the arm on the side of the breast you are examining above your head. 2. Feel your breast with the other hand. 3. Start in the nipple area and make -inch (2 cm) overlapping circles to feel your breast. Use the pads of your three middle fingers to do this. Apply light pressure, then medium pressure, then firm pressure. The light pressure will allow you to feel the tissue closest to the skin. The medium pressure will allow you to feel the tissue that is a little deeper. The firm pressure will allow you to feel the tissue close to the ribs. 4. Continue the overlapping circles, moving downward over the breast until you feel your ribs below your breast. 5. Move one finger-width toward the center of the body. Continue to use the -inch (2 cm) overlapping circles to feel your breast as you move slowly up toward your collarbone. 6. Continue the up-and-down exam using all three pressures until you reach your armpit.  Write down what you find Writing down what you find can help you remember what to discuss with your health care provider. Write down:  What is normal for each breast.  Any changes that you find in each breast, including: ? The kind of changes you find. ? Any pain or tenderness. ? Size and location of any lumps.  Where you are in your menstrual cycle, if you are still menstruating. General tips and recommendations  Examine your breasts every month.  If you are breastfeeding, the best time to examine your breasts is after a feeding or after using a breast pump.  If you menstruate, the best time to examine your breasts is 5-7 days after your period. Breasts are generally lumpier during menstrual periods, and it may be more difficult to notice changes.  With time  and practice, you will become more familiar with the variations in your breasts and more comfortable with the exam. Contact a health care provider if you:  See a change in the shape or size of your breasts or nipples.  See a change in the skin of your breast or nipples, such as a reddened or scaly area.  Have unusual discharge from your nipples.  Find a lump or thick area that was not there before.  Have pain in your breasts.  Have any concerns related to your breast health. Summary  Breast   self-awareness includes looking for physical changes in your breasts, as well as feeling for any changes within your breasts.  Breast self-awareness should be performed in front of a mirror in a well-lit room.  You should examine your breasts every month. If you menstruate, the best time to examine your breasts is 5-7 days after your menstrual period.  Let your health care provider know of any changes you notice in your breasts, including changes in size, changes on the skin, pain or tenderness, or unusual fluid from your nipples. This information is not intended to replace advice given to you by your health care provider. Make sure you discuss any questions you have with your health care provider. Document Revised: 10/03/2017 Document Reviewed: 10/03/2017 Elsevier Patient Education  2020 Elsevier Inc.  

## 2019-06-29 LAB — CBC
Hematocrit: 41.3 % (ref 34.0–46.6)
Hemoglobin: 14 g/dL (ref 11.1–15.9)
MCH: 30 pg (ref 26.6–33.0)
MCHC: 33.9 g/dL (ref 31.5–35.7)
MCV: 88 fL (ref 79–97)
Platelets: 328 10*3/uL (ref 150–450)
RBC: 4.67 x10E6/uL (ref 3.77–5.28)
RDW: 12.5 % (ref 11.7–15.4)
WBC: 8.5 10*3/uL (ref 3.4–10.8)

## 2019-06-29 LAB — COMPREHENSIVE METABOLIC PANEL
ALT: 9 IU/L (ref 0–32)
AST: 12 IU/L (ref 0–40)
Albumin/Globulin Ratio: 1.5 (ref 1.2–2.2)
Albumin: 4.1 g/dL (ref 3.8–4.8)
Alkaline Phosphatase: 44 IU/L (ref 39–117)
BUN/Creatinine Ratio: 13 (ref 9–23)
BUN: 10 mg/dL (ref 6–20)
Bilirubin Total: <0.2 mg/dL (ref 0.0–1.2)
CO2: 20 mmol/L (ref 20–29)
Calcium: 9.6 mg/dL (ref 8.7–10.2)
Chloride: 102 mmol/L (ref 96–106)
Creatinine, Ser: 0.78 mg/dL (ref 0.57–1.00)
GFR calc Af Amer: 111 mL/min/{1.73_m2} (ref >59)
GFR calc non Af Amer: 96 mL/min/{1.73_m2} (ref >59)
Globulin, Total: 2.8 g/dL (ref 1.5–4.5)
Glucose: 77 mg/dL (ref 65–99)
Potassium: 4 mmol/L (ref 3.5–5.2)
Sodium: 137 mmol/L (ref 134–144)
Total Protein: 6.9 g/dL (ref 6.0–8.5)

## 2019-06-29 LAB — LIPID PANEL
Chol/HDL Ratio: 2.8 ratio (ref 0.0–4.4)
Cholesterol, Total: 220 mg/dL — ABNORMAL HIGH (ref 100–199)
HDL: 78 mg/dL (ref >39)
LDL Chol Calc (NIH): 126 mg/dL — ABNORMAL HIGH (ref 0–99)
Triglycerides: 93 mg/dL (ref 0–149)
VLDL Cholesterol Cal: 16 mg/dL (ref 5–40)

## 2020-06-25 ENCOUNTER — Other Ambulatory Visit: Payer: Self-pay | Admitting: Obstetrics and Gynecology

## 2020-06-26 NOTE — Telephone Encounter (Signed)
Further refills to be given at annual exam next month.

## 2020-06-29 NOTE — Patient Instructions (Incomplete)
Preventive Care 41-41 Years Old, Female Preventive care refers to lifestyle choices and visits with your health care provider that can promote health and wellness. This includes:  A yearly physical exam. This is also called an annual wellness visit.  Regular dental and eye exams.  Immunizations.  Screening for certain conditions.  Healthy lifestyle choices, such as: ? Eating a healthy diet. ? Getting regular exercise. ? Not using drugs or products that contain nicotine and tobacco. ? Limiting alcohol use. What can I expect for my preventive care visit? Physical exam Your health care provider will check your:  Height and weight. These may be used to calculate your BMI (body mass index). BMI is a measurement that tells if you are at a healthy weight.  Heart rate and blood pressure.  Body temperature.  Skin for abnormal spots. Counseling Your health care provider may ask you questions about your:  Past medical problems.  Family's medical history.  Alcohol, tobacco, and drug use.  Emotional well-being.  Home life and relationship well-being.  Sexual activity.  Diet, exercise, and sleep habits.  Work and work Statistician.  Access to firearms.  Method of birth control.  Menstrual cycle.  Pregnancy history. What immunizations do I need? Vaccines are usually given at various ages, according to a schedule. Your health care provider will recommend vaccines for you based on your age, medical history, and lifestyle or other factors, such as travel or where you work.   What tests do I need? Blood tests  Lipid and cholesterol levels. These may be checked every 5 years, or more often if you are over 3 years old.  Hepatitis C test.  Hepatitis B test. Screening  Lung cancer screening. You may have this screening every year starting at age 41 if you have a 30-pack-year history of smoking and currently smoke or have quit within the past 15 years.  Colorectal cancer  screening. ? All adults should have this screening starting at age 41 and continuing until age 17. ? Your health care provider may recommend screening at age 41 if you are at increased risk. ? You will have tests every 1-10 years, depending on your results and the type of screening test.  Diabetes screening. ? This is done by checking your blood sugar (glucose) after you have not eaten for a while (fasting). ? You may have this done every 1-3 years.  Mammogram. ? This may be done every 1-2 years. ? Talk with your health care provider about when you should start having regular mammograms. This may depend on whether you have a family history of breast cancer.  BRCA-related cancer screening. This may be done if you have a family history of breast, ovarian, tubal, or peritoneal cancers.  Pelvic exam and Pap test. ? This may be done every 3 years starting at age 41. ? Starting at age 41, this may be done every 5 years if you have a Pap test in combination with an HPV test. Other tests  STD (sexually transmitted disease) testing, if you are at risk.  Bone density scan. This is done to screen for osteoporosis. You may have this scan if you are at high risk for osteoporosis. Talk with your health care provider about your test results, treatment options, and if necessary, the need for more tests. Follow these instructions at home: Eating and drinking  Eat a diet that includes fresh fruits and vegetables, whole grains, lean protein, and low-fat dairy products.  Take vitamin and mineral supplements  as recommended by your health care provider.  Do not drink alcohol if: ? Your health care provider tells you not to drink. ? You are pregnant, may be pregnant, or are planning to become pregnant.  If you drink alcohol: ? Limit how much you have to 0-1 drink a [place]. ? Be aware of how much alcohol is in your drink. In the [place] one drink equals one 12 oz bottle of beer (355 mL), one 5 oz glass of  wine (148 mL), or one 1 oz glass of hard liquor (44 mL).   Lifestyle  Take daily care of your teeth and gums. Brush your teeth every morning and night with fluoride toothpaste. Floss one time each [place].  Stay active. Exercise for at least 30 minutes 5 or more days each week.  Do not use any products that contain nicotine or tobacco, such as cigarettes, e-cigarettes, and chewing tobacco. If you need help quitting, ask your health care provider.  Do not use drugs.  If you are sexually active, practice safe sex. Use a condom or other form of protection to prevent STIs (sexually transmitted infections).  If you do not wish to become pregnant, use a form of birth control. If you plan to become pregnant, see your health care provider for a prepregnancy visit.  If told by your health care provider, take low-dose aspirin daily starting at age 41.  Find healthy ways to cope with stress, such as: ? Meditation, yoga, or listening to music. ? Journaling. ? Talking to a trusted person. ? Spending time with friends and family. Safety  Always wear your seat belt while driving or riding in a vehicle.  Do not drive: ? If you have been drinking alcohol. Do not ride with someone who has been drinking. ? When you are tired or distracted. ? While texting.  Wear a helmet and other protective equipment during sports activities.  If you have firearms in your house, make sure you follow all gun safety procedures. What's next?  Visit your health care provider once a year for an annual wellness visit.  Ask your health care provider how often you should have your eyes and teeth checked.  Stay up to date on all vaccines. This information is not intended to replace advice given to you by your health care provider. Make sure you discuss any questions you have with your health care provider. Document Revised: 11/19/2019 Document Reviewed: 10/26/2017 Elsevier Patient Education  2021 [place] Breast self-awareness is knowing how your breasts look and feel. Doing breast self-awareness is important. It allows you to catch a breast problem early while it is still small and can be treated. All women should do breast self-awareness, including women who have had breast implants. Tell your doctor if you notice a change in your breasts. What you need:  A mirror.  A well-lit room. How to do a breast self-exam A breast self-exam is one way to learn what is normal for your breasts and to check for changes. To do a breast self-exam: Look for changes 1. Take off all the clothes above your waist. 2. Stand in front of a mirror in a room with good lighting. 3. Put your hands on your hips. 4. Push your hands down. 5. Look at your breasts and nipples in the mirror to see if one breast or nipple looks different from the other. Check to see if: ? The shape of one breast is different. ? The size of  one breast is different. ? There are wrinkles, dips, and bumps in one breast and not the other. 6. Look at each breast for changes in the skin, such as: ? Redness. ? Scaly areas. 7. Look for changes in your nipples, such as: ? Liquid around the nipples. ? Bleeding. ? Dimpling. ? Redness. ? A change in where the nipples are.   Feel for changes 1. Lie on your back on the floor. 2. Feel each breast. To do this, follow these steps: ? Pick a breast to feel. ? Put the arm closest to that breast above your head. ? Use your other arm to feel the nipple area of your breast. Feel the area with the pads of your three middle fingers by making small circles with your fingers. For the first circle, press lightly. For the second circle, press harder. For the third circle, press even harder. ? Keep making circles with your fingers at the different pressures as you move down your breast. Stop when you feel your ribs. ? Move your fingers a little toward the center of your body. ? Start making  circles with your fingers again, this time going up until you reach your collarbone. ? Keep making up-and-down circles until you reach your armpit. Remember to keep using the three pressures. ? Feel the other breast in the same way. 3. Sit or stand in the tub or shower. 4. With soapy water on your skin, feel each breast the same way you did in step 2 when you were lying on the floor.   Write down what you find Writing down what you find can help you remember what to tell your doctor. Write down:  What is normal for each breast.  Any changes you find in each breast, including: ? The kind of changes you find. ? Whether you have pain. ? Size and location of any lumps.  When you last had your menstrual period. General tips  Check your breasts every month.  If you are breastfeeding, the best time to check your breasts is after you feed your baby or after you use a breast pump.  If you get menstrual periods, the best time to check your breasts is 5-7 days after your menstrual period is over.  With time, you will become comfortable with the self-exam, and you will begin to know if there are changes in your breasts. Contact a doctor if you:  See a change in the shape or size of your breasts or nipples.  See a change in the skin of your breast or nipples, such as red or scaly skin.  Have fluid coming from your nipples that is not normal.  Find a lump or thick area that was not there before.  Have pain in your breasts.  Have any concerns about your breast health. Summary  Breast self-awareness includes looking for changes in your breasts, as well as feeling for changes within your breasts.  Breast self-awareness should be done in front of a mirror in a well-lit room.  You should check your breasts every month. If you get menstrual periods, the best time to check your breasts is 5-7 days after your menstrual period is over.  Let your doctor know of any changes you see in your  breasts, including changes in size, changes on the skin, pain or tenderness, or fluid from your nipples that is not normal. This information is not intended to replace advice given to you by your health care provider. Make sure  you discuss any questions you have with your health care provider. Document Revised: 10/03/2017 Document Reviewed: 10/03/2017 Elsevier Patient Education  [place].

## 2020-06-30 ENCOUNTER — Encounter: Payer: BC Managed Care – PPO | Admitting: Obstetrics and Gynecology

## 2020-07-25 ENCOUNTER — Other Ambulatory Visit: Payer: Self-pay | Admitting: Obstetrics and Gynecology

## 2020-07-28 ENCOUNTER — Telehealth: Payer: Self-pay | Admitting: Obstetrics and Gynecology

## 2020-07-28 NOTE — Telephone Encounter (Signed)
Whitney Velasquez called in and states that the pharmacy is waiting on Dr. Valentino Saxon to send in a prescription for NuvaRing.  Whitney Velasquez would like that called in to [facility] in [place] please.

## 2020-07-29 ENCOUNTER — Other Ambulatory Visit: Payer: Self-pay

## 2020-07-29 MED ORDER — ETONOGESTREL-ETHINYL ESTRADIOL 0.12-0.015 MG/24HR VA RING: VAGINAL_RING | VAGINAL | 0 refills | Status: DC

## 2020-07-29 NOTE — Telephone Encounter (Signed)
Please see mychart encounters.  

## 2020-08-30 ENCOUNTER — Other Ambulatory Visit: Payer: Self-pay | Admitting: Obstetrics and Gynecology

## 2020-09-04 ENCOUNTER — Encounter: Payer: Self-pay | Admitting: Obstetrics and Gynecology

## 2020-09-04 ENCOUNTER — Other Ambulatory Visit: Payer: Self-pay

## 2020-09-04 ENCOUNTER — Other Ambulatory Visit (HOSPITAL_COMMUNITY)
Admission: RE | Admit: 2020-09-04 | Discharge: 2020-09-04 | Disposition: A | Discharge status: Home / Self Care | Payer: BC Managed Care – PPO | Source: Ambulatory Visit | Attending: Obstetrics and Gynecology | Admitting: Obstetrics and Gynecology

## 2020-09-04 ENCOUNTER — Ambulatory Visit (INDEPENDENT_AMBULATORY_CARE_PROVIDER_SITE_OTHER): Payer: Self-pay | Admitting: Obstetrics and Gynecology

## 2020-09-04 VITALS — BP 124/85 | HR 78 | Ht 63.0 in | Wt 174.2 lb

## 2020-09-04 DIAGNOSIS — Z124 Encounter for screening for malignant neoplasm of cervix: Secondary | ICD-10-CM | POA: Insufficient documentation

## 2020-09-04 DIAGNOSIS — E669 Obesity, unspecified: Secondary | ICD-10-CM

## 2020-09-04 DIAGNOSIS — Z01419 Encounter for gynecological examination (general) (routine) without abnormal findings: Secondary | ICD-10-CM

## 2020-09-04 DIAGNOSIS — N888 Other specified noninflammatory disorders of cervix uteri: Secondary | ICD-10-CM | POA: Diagnosis not present

## 2020-09-04 DIAGNOSIS — Z1231 Encounter for screening mammogram for malignant neoplasm of breast: Secondary | ICD-10-CM

## 2020-09-04 MED ORDER — ETONOGESTREL-ETHINYL ESTRADIOL 0.12-0.015 MG/24HR VA RING: VAGINAL_RING | VAGINAL | 4 refills | Status: DC

## 2020-09-04 NOTE — Patient Instructions (Signed)
Breast Self-Awareness Breast self-awareness is knowing how your breasts look and feel. Doing breast self-awareness is important. It allows you to catch a breast problem early while it is still small and can be treated. All women should do breast self-awareness, including women who have had breast implants. Tell your doctorif you notice a change in your breasts. What you need: A mirror. A well-lit room. How to do a breast self-exam A breast self-exam is one way to learn what is normal for your breasts and tocheck for changes. To do a breast self-exam: Look for changes  Take off all the clothes above your waist. Stand in front of a mirror in a room with good lighting. Put your hands on your hips. Push your hands down. Look at your breasts and nipples in the mirror to see if one breast or nipple looks different from the other. Check to see if: The shape of one breast is different. The size of one breast is different. There are wrinkles, dips, and bumps in one breast and not the other. Look at each breast for changes in the skin, such as: Redness. Scaly areas. Look for changes in your nipples, such as: Liquid around the nipples. Bleeding. Dimpling. Redness. A change in where the nipples are.  Feel for changes  Lie on your back on the floor. Feel each breast. To do this, follow these steps: Pick a breast to feel. Put the arm closest to that breast above your head. Use your other arm to feel the nipple area of your breast. Feel the area with the pads of your three middle fingers by making small circles with your fingers. For the first circle, press lightly. For the second circle, press harder. For the third circle, press even harder. Keep making circles with your fingers at the different pressures as you move down your breast. Stop when you feel your ribs. Move your fingers a little toward the center of your body. Start making circles with your fingers again, this time going up until  you reach your collarbone. Keep making up-and-down circles until you reach your armpit. Remember to keep using the three pressures. Feel the other breast in the same way. Sit or stand in the tub or shower. With soapy water on your skin, feel each breast the same way you did in step 2 when you were lying on the floor.  Write down what you find Writing down what you find can help you remember what to tell your doctor. Write down: What is normal for each breast. Any changes you find in each breast, including: The kind of changes you find. Whether you have pain. Size and location of any lumps. When you last had your menstrual period. General tips Check your breasts every month. If you are breastfeeding, the best time to check your breasts is after you feed your baby or after you use a breast pump. If you get menstrual periods, the best time to check your breasts is 5-7 days after your menstrual period is over. With time, you will become comfortable with the self-exam, and you will begin to know if there are changes in your breasts. Contact a doctor if you: See a change in the shape or size of your breasts or nipples. See a change in the skin of your breast or nipples, such as red or scaly skin. Have fluid coming from your nipples that is not normal. Find a lump or thick area that was not there before. Have pain in   your breasts. Have any concerns about your breast health. Summary Breast self-awareness includes looking for changes in your breasts, as well as feeling for changes within your breasts. Breast self-awareness should be done in front of a mirror in a well-lit room. You should check your breasts every month. If you get menstrual periods, the best time to check your breasts is 5-7 days after your menstrual period is over. Let your doctor know of any changes you see in your breasts, including changes in size, changes on the skin, pain or tenderness, or fluid from your nipples that is  not normal. This information is not intended to replace advice given to you by your health care provider. Make sure you discuss any questions you have with your healthcare provider. Document Revised: 10/03/2017 Document Reviewed: 10/03/2017 Elsevier Patient Education  2022 Elsevier Inc.     Preventive Care 40-64 Years Old, Female Preventive care refers to lifestyle choices and visits with your health care provider that can promote health and wellness. This includes: A yearly physical exam. This is also called an annual wellness visit. Regular dental and eye exams. Immunizations. Screening for certain conditions. Healthy lifestyle choices, such as: Eating a healthy diet. Getting regular exercise. Not using drugs or products that contain nicotine and tobacco. Limiting alcohol use. What can I expect for my preventive care visit? Physical exam Your health care provider will check your: Height and weight. These may be used to calculate your BMI (body mass index). BMI is a measurement that tells if you are at a healthy weight. Heart rate and blood pressure. Body temperature. Skin for abnormal spots. Counseling Your health care provider may ask you questions about your: Past medical problems. Family's medical history. Alcohol, tobacco, and drug use. Emotional well-being. Home life and relationship well-being. Sexual activity. Diet, exercise, and sleep habits. Work and work environment. Access to firearms. Method of birth control. Menstrual cycle. Pregnancy history. What immunizations do I need?  Vaccines are usually given at various ages, according to a schedule. Your health care provider will recommend vaccines for you based on your age, medicalhistory, and lifestyle or other factors, such as travel or where you work. What tests do I need? Blood tests Lipid and cholesterol levels. These may be checked every 5 years, or more often if you are over 50 years old. Hepatitis C  test. Hepatitis B test. Screening Lung cancer screening. You may have this screening every year starting at age 55 if you have a 30-pack-year history of smoking and currently smoke or have quit within the past 15 years. Colorectal cancer screening. All adults should have this screening starting at age 50 and continuing until age 75. Your health care provider may recommend screening at age 45 if you are at increased risk. You will have tests every 1-10 years, depending on your results and the type of screening test. Diabetes screening. This is done by checking your blood sugar (glucose) after you have not eaten for a while (fasting). You may have this done every 1-3 years. Mammogram. This may be done every 1-2 years. Talk with your health care provider about when you should start having regular mammograms. This may depend on whether you have a family history of breast cancer. BRCA-related cancer screening. This may be done if you have a family history of breast, ovarian, tubal, or peritoneal cancers. Pelvic exam and Pap test. This may be done every 3 years starting at age 21. Starting at age 30, this may be done every   5 years if you have a Pap test in combination with an HPV test. Other tests STD (sexually transmitted disease) testing, if you are at risk. Bone density scan. This is done to screen for osteoporosis. You may have this scan if you are at high risk for osteoporosis. Talk with your health care provider about your test results, treatment options,and if necessary, the need for more tests. Follow these instructions at home: Eating and drinking  Eat a diet that includes fresh fruits and vegetables, whole grains, lean protein, and low-fat dairy products. Take vitamin and mineral supplements as recommended by your health care provider. Do not drink alcohol if: Your health care provider tells you not to drink. You are pregnant, may be pregnant, or are planning to become pregnant. If  you drink alcohol: Limit how much you have to 0-1 drink a [place]. Be aware of how much alcohol is in your drink. In the [place] one drink equals one 12 oz bottle of beer (355 mL), one 5 oz glass of wine (148 mL), or one 1 oz glass of hard liquor (44 mL).  Lifestyle Take daily care of your teeth and gums. Brush your teeth every morning and night with fluoride toothpaste. Floss one time each [place]. Stay active. Exercise for at least 30 minutes 5 or more days each week. Do not use any products that contain nicotine or tobacco, such as cigarettes, e-cigarettes, and chewing tobacco. If you need help quitting, ask your health care provider. Do not use drugs. If you are sexually active, practice safe sex. Use a condom or other form of protection to prevent STIs (sexually transmitted infections). If you do not wish to become pregnant, use a form of birth control. If you plan to become pregnant, see your health care provider for a prepregnancy visit. If told by your health care provider, take low-dose aspirin daily starting at age 50. Find healthy ways to cope with stress, such as: Meditation, yoga, or listening to music. Journaling. Talking to a trusted person. Spending time with friends and family. Safety Always wear your seat belt while driving or riding in a vehicle. Do not drive: If you have been drinking alcohol. Do not ride with someone who has been drinking. When you are tired or distracted. While texting. Wear a helmet and other protective equipment during sports activities. If you have firearms in your house, make sure you follow all gun safety procedures. What's next? Visit your health care provider once a year for an annual wellness visit. Ask your health care provider how often you should have your eyes and teeth checked. Stay up to date on all vaccines. This information is not intended to replace advice given to you by your health care provider. Make sure you discuss any questions you  have with your healthcare provider. Document Revised: 11/19/2019 Document Reviewed: 10/26/2017 Elsevier Patient Education  2022 Elsevier Inc.  

## 2020-09-04 NOTE — Progress Notes (Signed)
GYNECOLOGY ANNUAL PHYSICAL EXAM PROGRESS NOTE  Subjective:    Whitney Velasquez is a 40 y.o. [id number] female who presents for an annual exam. The patient is sexually active.  The patient wears seatbelts: yes. The patient participates in regular exercise: not asked. Has the patient ever been transfused or tattooed?: no. The patient reports that there is not domestic violence in her life.   The patient has the following complaints today:  1. None  Gynecologic History Patient's last menstrual period was 08/13/2020. Menarche age: 10.  Cycles currently regular, moderate flow. Lasting 4-5 days. Mild dysmenorrhea Contraception: NuvaRing vaginal inserts History of STI's: Denies Last Pap: 07/19/2017.  Results were: normal.  Denies h/o abnormal pap smears. Patient has never had a mammogram.    OB History  Gravida Para Term Preterm AB Living  5 3 3  0 2 4  SAB IAB Ectopic Multiple Live Births  1 0 0 1 4    # Outcome Date GA Lbr Len/2nd Weight Sex Delivery Anes PTL Lv  5 Term 04/29/16 [redacted]w[redacted]d  7 lb 3.7 oz (3.28 kg) F CS-LTranv Spinal  LIV     Name: Gaynor,GIRL Calani     Apgar1: 9  Apgar5: 9  4 SAB 04/2015        FD  3A Term 2004 [redacted]w[redacted]d  4 lb 9 oz (2.07 kg) F Vag-Spont  N LIV  3B Term 2004 [redacted]w[redacted]d  4 lb 4 oz (1.928 kg) M Vag-Spont  N LIV  2 Term 2000 [redacted]w[redacted]d  8 lb 2.6 oz (3.702 kg) M CS-Unspec  N LIV     Complications: Failure to Progress in First Stage  1 AB 1998        FD    Obstetric Comments  2000-failure to progress  2004 twins-female and female    Past Medical History:  Diagnosis Date   Asthma    Last asthma exacerbation requiring hospitalization was in 2013, no intubations.   Vaginal Pap smear, abnormal    was scheduled for colpo but found out she was pregnant    Past Surgical History:  Procedure Laterality Date   CESAREAN SECTION     CESAREAN SECTION N/A 04/29/2016   Procedure: CESAREAN SECTION;  Surgeon: 06/29/2016, MD;  Location: [facility] ORS;  Service: Obstetrics;  Laterality: N/A;     Family History  Problem Relation Age of Onset   Fibroids Mother    Diabetes Maternal Grandmother    Heart disease Maternal Grandmother        CAD   Cancer Paternal Grandfather    Rashes / Skin problems Son        eczema   Rashes / Skin problems Daughter        eczema    Social History   Socioeconomic History   Marital status: Married    Spouse name: Not on file   Number of children: Not on file   Years of education: Not on file   Highest education level: Not on file  Occupational History   Occupation: fiancial    Comment: [employer]  Tobacco Use   Smoking status: Never   Smokeless tobacco: Never  Vaping Use   Vaping Use: Never used  Substance and Sexual Activity   Alcohol use: Yes    Alcohol/week: 0.0 standard drinks   Drug use: No   Sexual activity: Yes    Partners: Male    Comment: nuvaring  Other Topics Concern   Not on file  Social History Narrative  Not on file   Social Determinants of Health   Financial Resource Strain: Not on file  Food Insecurity: Not on file  Transportation Needs: Not on file  Physical Activity: Not on file  Stress: Not on file  Social Connections: Not on file  Intimate Partner Violence: Not on file    Current Outpatient Medications on File Prior to Visit  Medication Sig Dispense Refill   albuterol (PROVENTIL HFA;VENTOLIN HFA) 108 (90 Base) MCG/ACT inhaler Inhale 2 puffs into the lungs every 6 (six) hours as needed.     docusate sodium (COLACE) 100 MG capsule Take 1 capsule (100 mg total) by mouth 2 (two) times daily as needed for mild constipation. 60 capsule 6   fluticasone-salmeterol (ADVAIR HFA) 115-21 MCG/ACT inhaler Inhale 2 puffs into the lungs 2 (two) times daily as needed.     phentermine 37.5 MG capsule Take 37.5 mg by mouth every morning.     No current facility-administered medications on file prior to visit.    Allergies  Allergen Reactions   Penicillin G Other (See Comments)    unknown      Review of Systems Constitutional: negative for chills, fatigue, fevers and sweats Eyes: negative for irritation, redness and visual disturbance Ears, nose, mouth, throat, and face: negative for hearing loss, nasal congestion, snoring and tinnitus Respiratory: negative for asthma, cough, sputum Cardiovascular: negative for chest pain, dyspnea, exertional chest pressure/discomfort, irregular heart beat, palpitations and syncope Gastrointestinal: negative for abdominal pain, change in bowel habits, nausea and vomiting Genitourinary: negative for abnormal menstrual periods, genital lesions, sexual problems and vaginal discharge, dysuria and urinary incontinence Integument/breast: negative for breast lump, breast tenderness and nipple discharge Hematologic/lymphatic: negative for bleeding and easy bruising Musculoskeletal:negative for back pain and muscle weakness Neurological: negative for dizziness, headaches, vertigo and weakness Endocrine: negative for diabetic symptoms including polydipsia, polyuria and skin dryness Allergic/Immunologic: negative for hay fever and urticaria        Objective:  Blood pressure 124/85, pulse 78, height 5\' 3"  (1.6 m), weight 174 lb 3.2 oz (79 kg), last menstrual period 08/13/2020, currently breastfeeding. Body mass index is 30.86 kg/m.    General Appearance:    Alert, cooperative, no distress, appears stated age, mild obesity   Head:    Normocephalic, without obvious abnormality, atraumatic  Eyes:    PERRL, conjunctiva/corneas clear, EOM's intact, both eyes  Ears:    Normal external ear canals, both ears  Nose:   Nares normal, septum midline, mucosa normal, no drainage or sinus tenderness  Throat:   Lips, mucosa, and tongue normal; teeth and gums normal  Neck:   Supple, symmetrical, trachea midline, no adenopathy; thyroid: no enlargement/tenderness/nodules; no carotid bruit or JVD  Back:     Symmetric, no curvature, ROM normal, no CVA tenderness   Lungs:     Clear to auscultation bilaterally, respirations unlabored  Chest Wall:    No tenderness or deformity   Heart:    Regular rate and rhythm, S1 and S2 normal, no murmur, rub or gallop  Breast Exam:    No tenderness, masses, or nipple abnormality  Abdomen:     Soft, non-tender, bowel sounds active all four quadrants, no masses, no organomegaly.    Genitalia:    Pelvic:external genitalia normal, vagina without lesions, discharge, or tenderness, Nuvaring in place.  Rectovaginal septum  normal. Cervix with what appears to be large nabothian cyst, ~ 3-4 cm. No cervical motion tenderness, no adnexal masses or tenderness.  Uterus normal size, shape, mobile, regular  contours, nontender.  Rectal:    Normal external sphincter.  No hemorrhoids appreciated. Internal exam not done.   Extremities:   Extremities normal, atraumatic, no cyanosis or edema  Pulses:   2+ and symmetric all extremities  Skin:   Skin color, texture, turgor normal, no rashes or lesions  Lymph nodes:   Cervical, supraclavicular, and axillary nodes normal  Neurologic:   CNII-XII intact, normal strength, sensation and reflexes throughout   .  Labs:  Lab Results  Component Value Date   WBC 8.5 06/28/2019   HGB 14.0 06/28/2019   HCT 41.3 06/28/2019   MCV 88 06/28/2019   PLT 328 06/28/2019    Lab Results  Component Value Date   CREATININE 0.78 06/28/2019   BUN 10 06/28/2019   NA 137 06/28/2019   K 4.0 06/28/2019   CL 102 06/28/2019   CO2 20 06/28/2019    Lab Results  Component Value Date   ALT 9 06/28/2019   AST 12 06/28/2019   ALKPHOS 44 06/28/2019   BILITOT <0.2 06/28/2019    Lab Results  Component Value Date   TSH 0.737 07/19/2017     Assessment:   1. Encounter for well woman exam with routine gynecological exam   2. Pap smear for cervical cancer screening   3. Breast cancer screening by mammogram   4. Nabothian cyst    Plan:    - Blood tests: None. Labs to be performed by PCP.  - Breast  self exam technique reviewed and patient encouraged to perform self-exam monthly. - Mammogram ordered.  - Contraception:NuvaRing. Refill given.  - Discussed healthy lifestyle modifications. - Pap smear performed today.  - Attempted to rupture suspected nabothian cyst however unsuccessful. More of a flesh-colored cervical mass (slightly more solid component) than cyst. Biopsy taken of mass.  - COVID vaccination status: Has completed 2 dose series. Eligible for booster.  - Follow up in 1 year for annual exam.    Hildred Laser, MD [facility]

## 2020-09-04 NOTE — Progress Notes (Signed)
Pt present for annual exam. Pt stated that she was doing well.  

## 2020-09-04 NOTE — Addendum Note (Signed)
Addended by: Silvano Bilis on: 09/04/2020 03:23 PM   Modules accepted: Orders

## 2020-09-08 LAB — SURGICAL PATHOLOGY

## 2020-09-14 LAB — CYTOLOGY - PAP
Adequacy: ABSENT
Comment: NEGATIVE
Diagnosis: NEGATIVE
High risk HPV: NEGATIVE

## 2020-10-17 ENCOUNTER — Other Ambulatory Visit: Payer: Self-pay | Admitting: Obstetrics and Gynecology

## 2021-05-21 ENCOUNTER — Other Ambulatory Visit: Payer: Self-pay

## 2021-05-21 ENCOUNTER — Ambulatory Visit
Admission: RE | Admit: 2021-05-21 | Discharge: 2021-05-21 | Disposition: A | Discharge status: Home / Self Care | Payer: BC Managed Care – PPO | Source: Ambulatory Visit | Attending: Obstetrics and Gynecology | Admitting: Obstetrics and Gynecology

## 2021-05-21 ENCOUNTER — Other Ambulatory Visit: Payer: Self-pay | Admitting: Obstetrics and Gynecology

## 2021-05-21 DIAGNOSIS — R928 Other abnormal and inconclusive findings on diagnostic imaging of breast: Secondary | ICD-10-CM

## 2021-05-21 DIAGNOSIS — N6489 Other specified disorders of breast: Secondary | ICD-10-CM

## 2021-05-21 DIAGNOSIS — Z1231 Encounter for screening mammogram for malignant neoplasm of breast: Secondary | ICD-10-CM | POA: Diagnosis present

## 2021-06-11 ENCOUNTER — Ambulatory Visit
Admission: RE | Admit: 2021-06-11 | Discharge: 2021-06-11 | Disposition: A | Discharge status: Home / Self Care | Payer: BC Managed Care – PPO | Source: Ambulatory Visit | Attending: Obstetrics and Gynecology | Admitting: Obstetrics and Gynecology

## 2021-06-11 DIAGNOSIS — N6489 Other specified disorders of breast: Secondary | ICD-10-CM

## 2021-06-11 DIAGNOSIS — R928 Other abnormal and inconclusive findings on diagnostic imaging of breast: Secondary | ICD-10-CM | POA: Diagnosis present

## 2021-06-14 ENCOUNTER — Other Ambulatory Visit: Payer: Self-pay | Admitting: Obstetrics and Gynecology

## 2021-06-14 DIAGNOSIS — R928 Other abnormal and inconclusive findings on diagnostic imaging of breast: Secondary | ICD-10-CM

## 2021-06-14 DIAGNOSIS — N63 Unspecified lump in unspecified breast: Secondary | ICD-10-CM

## 2021-06-15 ENCOUNTER — Telehealth: Payer: Self-pay | Admitting: Obstetrics and Gynecology

## 2021-06-15 NOTE — Telephone Encounter (Signed)
pt called stating abnormal finding on breast mammogram - [place] bx ordered by [place] pending your signature. ?

## 2021-06-24 ENCOUNTER — Ambulatory Visit
Admission: RE | Admit: 2021-06-24 | Discharge: 2021-06-24 | Disposition: A | Discharge status: Home / Self Care | Payer: BC Managed Care – PPO | Source: Ambulatory Visit | Attending: Obstetrics and Gynecology | Admitting: Obstetrics and Gynecology

## 2021-06-24 DIAGNOSIS — R928 Other abnormal and inconclusive findings on diagnostic imaging of breast: Secondary | ICD-10-CM | POA: Diagnosis present

## 2021-06-24 DIAGNOSIS — N63 Unspecified lump in unspecified breast: Secondary | ICD-10-CM | POA: Diagnosis present

## 2021-06-25 LAB — SURGICAL PATHOLOGY

## 2021-09-20 ENCOUNTER — Other Ambulatory Visit: Payer: Self-pay | Admitting: Obstetrics and Gynecology

## 2021-09-20 NOTE — Telephone Encounter (Signed)
Patient needs annual exam prior to next refill.  

## 2021-11-22 NOTE — Progress Notes (Deleted)
GYNECOLOGY ANNUAL PHYSICAL EXAM PROGRESS NOTE  Subjective:    Whitney Velasquez is a 41 y.o. [id number] female who presents for an annual exam. The patient has no complaints today. The patient {is/is not/has never been:13135} sexually active. The patient participates in regular exercise: {yes/no/not asked:9010}. Has the patient ever been transfused or tattooed?: {yes/no/not asked:9010}. The patient reports that there {is/is not:9024} domestic violence in her life.    Menstrual History: Menarche age: 11 No LMP recorded.    Gynecologic History:  Contraception: NuvaRing vaginal inserts History of STI's: Denies Last Pap: 09/04/2020. Results were: normal.  Denies h/o abnormal pap smears. Last mammogram: 05/21/2021. Results were: abnormal: Further evaluation suggested for possible asymmetry in the left breast.    OB History  Gravida Para Term Preterm AB Living  5 3 3  0 2 4  SAB IAB Ectopic Multiple Live Births  1 0 0 1 4    # Outcome Date GA Lbr Len/2nd Weight Sex Delivery Anes PTL Lv  5 Term 04/29/16 [redacted]w[redacted]d  7 lb 3.7 oz (3.28 kg) F CS-LTranv Spinal  LIV     Name: Janek,GIRL Emmarae     Apgar1: 9  Apgar5: 9  4 SAB 04/2015        FD  3A Term 2004 [redacted]w[redacted]d  4 lb 9 oz (2.07 kg) F Vag-Spont  N LIV  3B Term 2004 [redacted]w[redacted]d  4 lb 4 oz (1.928 kg) M Vag-Spont  N LIV  2 Term 2000 [redacted]w[redacted]d  8 lb 2.6 oz (3.702 kg) M CS-Unspec  N LIV     Complications: Failure to Progress in First Stage  1 AB 1998        FD    Obstetric Comments  2000-failure to progress  2004 twins-female and female    Past Medical History:  Diagnosis Date   Asthma    Last asthma exacerbation requiring hospitalization was in 2013, no intubations.   Vaginal Pap smear, abnormal    was scheduled for colpo but found out she was pregnant    Past Surgical History:  Procedure Laterality Date   CESAREAN SECTION     CESAREAN SECTION N/A 04/29/2016   Procedure: CESAREAN SECTION;  Surgeon: 06/29/2016, MD;  Location: [facility] ORS;  Service:  Obstetrics;  Laterality: N/A;    Family History  Problem Relation Age of Onset   Fibroids Mother    Diabetes Maternal Grandmother    Heart disease Maternal Grandmother        CAD   Cancer Paternal Grandfather    Rashes / Skin problems Son        eczema   Rashes / Skin problems Daughter        eczema    Social History   Socioeconomic History   Marital status: Married    Spouse name: Not on file   Number of children: Not on file   Years of education: Not on file   Highest education level: Not on file  Occupational History   Occupation: fiancial    Comment: [employer]  Tobacco Use   Smoking status: Never   Smokeless tobacco: Never  Vaping Use   Vaping Use: Never used  Substance and Sexual Activity   Alcohol use: Yes    Alcohol/week: 0.0 standard drinks of alcohol   Drug use: No   Sexual activity: Yes    Partners: Male    Comment: nuvaring  Other Topics Concern   Not on file  Social History Narrative   Not  on file   Social Determinants of Health   Financial Resource Strain: Not on file  Food Insecurity: Not on file  Transportation Needs: Not on file  Physical Activity: Not on file  Stress: Not on file  Social Connections: Not on file  Intimate Partner Violence: Not on file    Current Outpatient Medications on File Prior to Visit  Medication Sig Dispense Refill   albuterol (PROVENTIL HFA;VENTOLIN HFA) 108 (90 Base) MCG/ACT inhaler Inhale 2 puffs into the lungs every 6 (six) hours as needed.     docusate sodium (COLACE) 100 MG capsule Take 1 capsule (100 mg total) by mouth 2 (two) times daily as needed for mild constipation. 60 capsule 6   etonogestrel-ethinyl estradiol (NUVARING) 0.12-0.015 MG/24HR vaginal ring INSERT 1 RING VAGINALLY AS DIRECTED. REMOVE AFTER 3 WEEKS & WAIT 7 DAYS BEFORE INSERTING A NEW RING 3 each 0   fluticasone-salmeterol (ADVAIR HFA) 115-21 MCG/ACT inhaler Inhale 2 puffs into the lungs 2 (two) times daily as needed.      phentermine 37.5 MG capsule Take 37.5 mg by mouth every morning.     No current facility-administered medications on file prior to visit.    Allergies  Allergen Reactions   Penicillin G Other (See Comments)    unknown     Review of Systems Constitutional: negative for chills, fatigue, fevers and sweats Eyes: negative for irritation, redness and visual disturbance Ears, nose, mouth, throat, and face: negative for hearing loss, nasal congestion, snoring and tinnitus Respiratory: negative for asthma, cough, sputum Cardiovascular: negative for chest pain, dyspnea, exertional chest pressure/discomfort, irregular heart beat, palpitations and syncope Gastrointestinal: negative for abdominal pain, change in bowel habits, nausea and vomiting Genitourinary: negative for abnormal menstrual periods, genital lesions, sexual problems and vaginal discharge, dysuria and urinary incontinence Integument/breast: negative for breast lump, breast tenderness and nipple discharge Hematologic/lymphatic: negative for bleeding and easy bruising Musculoskeletal:negative for back pain and muscle weakness Neurological: negative for dizziness, headaches, vertigo and weakness Endocrine: negative for diabetic symptoms including polydipsia, polyuria and skin dryness Allergic/Immunologic: negative for hay fever and urticaria      Objective:  currently breastfeeding. There is no height or weight on file to calculate BMI.    General Appearance:    Alert, cooperative, no distress, appears stated age  Head:    Normocephalic, without obvious abnormality, atraumatic  Eyes:    PERRL, conjunctiva/corneas clear, EOM's intact, both eyes  Ears:    Normal external ear canals, both ears  Nose:   Nares normal, septum midline, mucosa normal, no drainage or sinus tenderness  Throat:   Lips, mucosa, and tongue normal; teeth and gums normal  Neck:   Supple, symmetrical, trachea midline, no adenopathy; thyroid: no  enlargement/tenderness/nodules; no carotid bruit or JVD  Back:     Symmetric, no curvature, ROM normal, no CVA tenderness  Lungs:     Clear to auscultation bilaterally, respirations unlabored  Chest Wall:    No tenderness or deformity   Heart:    Regular rate and rhythm, S1 and S2 normal, no murmur, rub or gallop  Breast Exam:    No tenderness, masses, or nipple abnormality  Abdomen:     Soft, non-tender, bowel sounds active all four quadrants, no masses, no organomegaly.    Genitalia:    Pelvic:external genitalia normal, vagina without lesions, discharge, or tenderness, rectovaginal septum  normal. Cervix normal in appearance, no cervical motion tenderness, no adnexal masses or tenderness.  Uterus normal size, shape, mobile, regular contours, nontender.  Rectal:    Normal external sphincter.  No hemorrhoids appreciated. Internal exam not done.   Extremities:   Extremities normal, atraumatic, no cyanosis or edema  Pulses:   2+ and symmetric all extremities  Skin:   Skin color, texture, turgor normal, no rashes or lesions  Lymph nodes:   Cervical, supraclavicular, and axillary nodes normal  Neurologic:   CNII-XII intact, normal strength, sensation and reflexes throughout   .  Labs:  Lab Results  Component Value Date   WBC 8.5 06/28/2019   HGB 14.0 06/28/2019   HCT 41.3 06/28/2019   MCV 88 06/28/2019   PLT 328 06/28/2019    Lab Results  Component Value Date   CREATININE 0.78 06/28/2019   BUN 10 06/28/2019   NA 137 06/28/2019   K 4.0 06/28/2019   CL 102 06/28/2019   CO2 20 06/28/2019    Lab Results  Component Value Date   ALT 9 06/28/2019   AST 12 06/28/2019   ALKPHOS 44 06/28/2019   BILITOT <0.2 06/28/2019    Lab Results  Component Value Date   TSH 0.737 07/19/2017     Assessment:   No diagnosis found.   Plan:  Blood tests: {blood tests:13147}. Breast self exam technique reviewed and patient encouraged to perform self-exam monthly. Contraception: NuvaRing  vaginal inserts. Discussed healthy lifestyle modifications. Mammogram  UTD Pap smear  UTD . COVID vaccination status: Flu Vaccine: Follow up in 1 year for annual exam   Hildred Laser, MD [facility]

## 2021-11-23 ENCOUNTER — Encounter: Payer: Self-pay | Admitting: Obstetrics and Gynecology

## 2021-12-01 NOTE — Progress Notes (Signed)
GYNECOLOGY ANNUAL PHYSICAL EXAM PROGRESS NOTE  Subjective:    Whitney Velasquez is a 41 y.o. [id number] female who presents for an annual exam.  The patient is sexually active. The patient participates in regular exercise: yes. Has the patient ever been transfused or tattooed?: yes. The patient reports that there is not domestic violence in her life.   The patient has the following complaints today.  She no longer is using the Nuvaring because she was having cramping and discomfort around the time of her menstrual cycle. She reports that since stopping the Nuvaring, most of her symptoms have resolved. Has not used the ring in several months.    Menstrual History: Menarche age: 49 Patient's last menstrual period was 11/24/2021 (approximate). Period Cycle (Days): 30 Period Duration (Days): 4-5 Menstrual Flow: Light, Moderate, Heavy Menstrual Control: Panty liner, Maxi pad, Tampon Menstrual Control Change Freq (Hours): 2-3 Dysmenorrhea: None      Gynecologic History:  Contraception: none History of STI's: denies Last Pap: 09/04/2020. Results were: normal.  Denies h/o abnormal pap smears. Last mammogram: 05/21/2021. Results were: abnormal, required biopsy of left breast, benign.    OB History  Gravida Para Term Preterm AB Living  5 3 3  0 2 4  SAB IAB Ectopic Multiple Live Births  1 0 0 1 4    # Outcome Date GA Lbr Len/2nd Weight Sex Delivery Anes PTL Lv  5 Term 04/29/16 [redacted]w[redacted]d  7 lb 3.7 oz (3.28 kg) F CS-LTranv Spinal  LIV     Name: Spray,GIRL Chaquetta     Apgar1: 9  Apgar5: 9  4 SAB 04/2015        FD  3A Term 2004 [redacted]w[redacted]d  4 lb 9 oz (2.07 kg) F Vag-Spont  N LIV  3B Term 2004 [redacted]w[redacted]d  4 lb 4 oz (1.928 kg) M Vag-Spont  N LIV  2 Term 2000 [redacted]w[redacted]d  8 lb 2.6 oz (3.702 kg) M CS-Unspec  N LIV     Complications: Failure to Progress in First Stage  1 AB 1998        FD    Obstetric Comments  2000-failure to progress  2004 twins-female and female    Past Medical History:  Diagnosis Date    Asthma    Last asthma exacerbation requiring hospitalization was in 2013, no intubations.   Vaginal Pap smear, abnormal    was scheduled for colpo but found out she was pregnant    Past Surgical History:  Procedure Laterality Date   CESAREAN SECTION     CESAREAN SECTION N/A 04/29/2016   Procedure: CESAREAN SECTION;  Surgeon: Rubie Maid, MD;  Location: [facility] ORS;  Service: Obstetrics;  Laterality: N/A;    Family History  Problem Relation Age of Onset   Fibroids Mother    Diabetes Maternal Grandmother    Heart disease Maternal Grandmother        CAD   Cancer Paternal Grandfather    Rashes / Skin problems Son        eczema   Rashes / Skin problems Daughter        eczema    Social History   Socioeconomic History   Marital status: Married    Spouse name: Not on file   Number of children: Not on file   Years of education: Not on file   Highest education level: Not on file  Occupational History   Occupation: fiancial    Comment: [employer]  Tobacco Use   Smoking status:  Never   Smokeless tobacco: Never  Vaping Use   Vaping Use: Never used  Substance and Sexual Activity   Alcohol use: Yes    Alcohol/week: 0.0 standard drinks of alcohol   Drug use: No   Sexual activity: Yes    Partners: Male    Comment: nuvaring  Other Topics Concern   Not on file  Social History Narrative   Not on file   Social Determinants of Health   Financial Resource Strain: Not on file  Food Insecurity: Not on file  Transportation Needs: Not on file  Physical Activity: Not on file  Stress: Not on file  Social Connections: Not on file  Intimate Partner Violence: Not on file    Current Outpatient Medications on File Prior to Visit  Medication Sig Dispense Refill   albuterol (PROVENTIL HFA;VENTOLIN HFA) 108 (90 Base) MCG/ACT inhaler Inhale 2 puffs into the lungs every 6 (six) hours as needed.     docusate sodium (COLACE) 100 MG capsule Take 1 capsule (100 mg total) by mouth 2  (two) times daily as needed for mild constipation. 60 capsule 6   etonogestrel-ethinyl estradiol (NUVARING) 0.12-0.015 MG/24HR vaginal ring INSERT 1 RING VAGINALLY AS DIRECTED. REMOVE AFTER 3 WEEKS & WAIT 7 DAYS BEFORE INSERTING A NEW RING 3 each 0   fluticasone-salmeterol (ADVAIR HFA) 115-21 MCG/ACT inhaler Inhale 2 puffs into the lungs 2 (two) times daily as needed.     phentermine 37.5 MG capsule Take 37.5 mg by mouth every morning.     No current facility-administered medications on file prior to visit.    Allergies  Allergen Reactions   Penicillin G Other (See Comments)    unknown     Review of Systems Constitutional: negative for chills, fatigue, fevers and sweats Eyes: negative for irritation, redness and visual disturbance Ears, nose, mouth, throat, and face: negative for hearing loss, nasal congestion, snoring and tinnitus Respiratory: negative for asthma, cough, sputum Cardiovascular: negative for chest pain, dyspnea, exertional chest pressure/discomfort, irregular heart beat, palpitations and syncope Gastrointestinal: negative for abdominal pain, change in bowel habits, nausea and vomiting Genitourinary: negative for abnormal menstrual periods, genital lesions, sexual problems and vaginal discharge, dysuria and urinary incontinence Integument/breast: negative for breast lump, breast tenderness and nipple discharge Hematologic/lymphatic: negative for bleeding and easy bruising Musculoskeletal:negative for back pain and muscle weakness Neurological: negative for dizziness, headaches, vertigo and weakness Endocrine: negative for diabetic symptoms including polydipsia, polyuria and skin dryness Allergic/Immunologic: negative for hay fever and urticaria      Objective:  Blood pressure 116/77, pulse 74, resp. rate 16, height 5\' 2"  (1.575 m), weight 162 lb 1.6 oz (73.5 kg), last menstrual period 11/24/2021, currently breastfeeding. Body mass index is 29.65 kg/m.  General  Appearance:    Alert, cooperative, no distress, appears stated age, overweight  Head:    Normocephalic, without obvious abnormality, atraumatic  Eyes:    PERRL, conjunctiva/corneas clear, EOM's intact, both eyes  Ears:    Normal external ear canals, both ears  Nose:   Nares normal, septum midline, mucosa normal, no drainage or sinus tenderness  Throat:   Lips, mucosa, and tongue normal; teeth and gums normal  Neck:   Supple, symmetrical, trachea midline, no adenopathy; thyroid: no enlargement/tenderness/nodules; no carotid bruit or JVD  Back:     Symmetric, no curvature, ROM normal, no CVA tenderness  Lungs:     Clear to auscultation bilaterally, respirations unlabored  Chest Wall:    No tenderness or deformity   Heart:  Regular rate and rhythm, S1 and S2 normal, no murmur, rub or gallop  Breast Exam:    No tenderness, masses, or nipple abnormality  Abdomen:     Soft, non-tender, bowel sounds active all four quadrants, no masses, no organomegaly.    Genitalia:    Pelvic:external genitalia normal, vagina without lesions, discharge, or tenderness, rectovaginal septum  normal. Cervix normal in appearance, no cervical motion tenderness, no adnexal masses or tenderness.  Uterus normal size, shape, mobile, regular contours, nontender.  Rectal:    Normal external sphincter.  No hemorrhoids appreciated. Internal exam not done.   Extremities:   Extremities normal, atraumatic, no cyanosis or edema  Pulses:   2+ and symmetric all extremities  Skin:   Skin color, texture, turgor normal, no rashes or lesions  Lymph nodes:   Cervical, supraclavicular, and axillary nodes normal  Neurologic:   CNII-XII intact, normal strength, sensation and reflexes throughout   .  Labs:  Labs performed by PCP  Assessment:   1. Encounter for well woman exam with routine gynecological exam   2. Encounter for screening mammogram for malignant neoplasm of breast   3. Overweight (BMI 25.0-29.9)   4. General counseling  and advice on female contraception      Plan:  Blood tests: none ordered, performed by PCP last wee ([facility] Every[facility]). Breast self exam technique reviewed and patient encouraged to perform self-exam monthly. Contraception:  currently on no contraception, but reports she is not very active currently.  I discussed option of use of prn birth control options such as condoms or Phexxi, or could consider lower dose of a different hormonal method. Patient ok to try Phexxi. Prescription given . Discussed healthy lifestyle modifications. Mammogram  UTD . For repeat next year.  Pap smear  UTD . COVID vaccination status: has completed Pfizer series, can receive booster.  Follow up in 1 year for annual exam   Hildred Laser, MD [facility]

## 2021-12-03 ENCOUNTER — Encounter: Payer: Self-pay | Admitting: Obstetrics and Gynecology

## 2021-12-03 ENCOUNTER — Ambulatory Visit (INDEPENDENT_AMBULATORY_CARE_PROVIDER_SITE_OTHER): Payer: BC Managed Care – PPO | Admitting: Obstetrics and Gynecology

## 2021-12-03 VITALS — BP 116/77 | HR 74 | Resp 16 | Ht 62.0 in | Wt 162.1 lb

## 2021-12-03 DIAGNOSIS — Z1231 Encounter for screening mammogram for malignant neoplasm of breast: Secondary | ICD-10-CM | POA: Diagnosis not present

## 2021-12-03 DIAGNOSIS — Z3009 Encounter for other general counseling and advice on contraception: Secondary | ICD-10-CM | POA: Diagnosis not present

## 2021-12-03 DIAGNOSIS — Z01419 Encounter for gynecological examination (general) (routine) without abnormal findings: Secondary | ICD-10-CM

## 2021-12-03 DIAGNOSIS — Z1159 Encounter for screening for other viral diseases: Secondary | ICD-10-CM

## 2021-12-03 DIAGNOSIS — E663 Overweight: Secondary | ICD-10-CM

## 2021-12-03 MED ORDER — PHEXXI 1.8-1-0.4 % VA GEL: VAGINAL | 2 refills | Status: DC

## 2021-12-03 NOTE — Patient Instructions (Signed)

## 2022-05-30 ENCOUNTER — Ambulatory Visit
Admission: RE | Admit: 2022-05-30 | Discharge: 2022-05-30 | Disposition: A | Discharge status: Home / Self Care | Payer: BC Managed Care – PPO | Source: Ambulatory Visit | Attending: Obstetrics and Gynecology | Admitting: Obstetrics and Gynecology

## 2022-05-30 DIAGNOSIS — Z1231 Encounter for screening mammogram for malignant neoplasm of breast: Secondary | ICD-10-CM | POA: Diagnosis not present

## 2022-05-30 DIAGNOSIS — Z01419 Encounter for gynecological examination (general) (routine) without abnormal findings: Secondary | ICD-10-CM | POA: Diagnosis present

## 2022-07-04 ENCOUNTER — Encounter: Payer: Self-pay | Admitting: Obstetrics and Gynecology

## 2022-07-26 MED ORDER — ANNOVERA 0.013-0.15 MG/24HR VA RING: 1.0000 | VAGINAL_RING | Freq: Once | VAGINAL | 0 refills | Status: AC

## 2023-02-20 ENCOUNTER — Ambulatory Visit
Admission: EM | Admit: 2023-02-20 | Discharge: 2023-02-20 | Disposition: A | Discharge status: Home / Self Care | Payer: BC Managed Care – PPO | Attending: Family Medicine | Admitting: Family Medicine

## 2023-02-20 DIAGNOSIS — J4541 Moderate persistent asthma with (acute) exacerbation: Secondary | ICD-10-CM | POA: Insufficient documentation

## 2023-02-20 DIAGNOSIS — J069 Acute upper respiratory infection, unspecified: Secondary | ICD-10-CM | POA: Diagnosis present

## 2023-02-20 LAB — RESP PANEL BY RT-PCR (FLU A&B, COVID) ARPGX2
Influenza A by PCR: NEGATIVE
Influenza B by PCR: NEGATIVE
SARS Coronavirus 2 by RT PCR: NEGATIVE

## 2023-02-20 MED ORDER — FLUTICASONE-SALMETEROL 115-21 MCG/ACT IN AERO: 2.0000 | INHALATION_SPRAY | Freq: Two times a day (BID) | RESPIRATORY_TRACT | 2 refills | Status: AC | PRN

## 2023-02-20 MED ORDER — HYDROCOD POLI-CHLORPHE POLI ER 10-8 MG/5ML PO SUER: 5.0000 mL | Freq: Two times a day (BID) | ORAL | 0 refills | Status: DC | PRN

## 2023-02-20 MED ORDER — DEXAMETHASONE SODIUM PHOSPHATE 10 MG/ML IJ SOLN
10.0000 mg | Freq: Once | INTRAMUSCULAR | Status: AC
  Administered 2023-02-20: 10 mg via INTRAMUSCULAR

## 2023-02-20 MED ORDER — ALBUTEROL SULFATE (2.5 MG/3ML) 0.083% IN NEBU
2.5000 mg | INHALATION_SOLUTION | Freq: Once | RESPIRATORY_TRACT | Status: AC
  Administered 2023-02-20: 2.5 mg via RESPIRATORY_TRACT

## 2023-02-20 MED ORDER — ALBUTEROL SULFATE 1.25 MG/3ML IN NEBU: 1.0000 | INHALATION_SOLUTION | Freq: Four times a day (QID) | RESPIRATORY_TRACT | 0 refills | Status: AC | PRN

## 2023-02-20 MED ORDER — PREDNISONE 10 MG (21) PO TBPK: ORAL_TABLET | Freq: Every day | ORAL | 0 refills | Status: AC

## 2023-02-20 NOTE — ED Triage Notes (Signed)
Sinus congestion started Sat. Feels better with this Sx started Sunday Fatigue SOB Wheezing Chest congestion Cough chest hurts when coughing Patient has hx of asthma

## 2023-02-20 NOTE — Discharge Instructions (Addendum)
Your COVID and influenza test are negative.  I suspect your asthma was flared up by an acute upper respiratory infection.  Stop by the pharmacy to pick up your prescriptions.  Use your albuterol nebulizer every 6 hours as needed for the next 48 hours and right before bedtime.

## 2023-02-20 NOTE — ED Provider Notes (Addendum)
[facility]    CSN: [id number] Arrival date & time: 02/20/23  9528      History   Chief Complaint Chief Complaint  Patient presents with   Cough   Wheezing   Shortness of Breath   Fatigue    HPI Whitney Velasquez is a 42 y.o. female.   HPI  History obtained from the patient. Whitney Velasquez presents for nasal congestion, coughing, wheezing, rattling in her chest and fatigue that started 2 days ago. Feels chest tightness and shortness of breath.  She has asthma but doesn't have any nebulizer solution. Her inhaler isn't helping.  "Went through 100 puffs last night." Has some back pain from couhging.  No fever, sore throat, vomiting or diarrhea.  No known sick contacts.    No history of smoking.    Past Medical History:  Diagnosis Date   Asthma    Last asthma exacerbation requiring hospitalization was in 2013, no intubations.   Vaginal Pap smear, abnormal    was scheduled for colpo but found out she was pregnant    Patient Active Problem List   Diagnosis Date Noted   H/O cesarean section 10/18/2015   H/O abnormal cervical Papanicolaou smear 10/18/2015   Abnormal laboratory test 09/10/2015   Adiposity 06/30/2015   Asthma, mild intermittent 04/02/2015    Past Surgical History:  Procedure Laterality Date   CESAREAN SECTION     CESAREAN SECTION N/A 04/29/2016   Procedure: CESAREAN SECTION;  Surgeon: Hildred Laser, MD;  Location: [facility] ORS;  Service: Obstetrics;  Laterality: N/A;    OB History     Gravida  5   Para  3   Term  3   Preterm      AB  2   Living  4      SAB  1   IAB      Ectopic      Multiple  1   Live Births  4        Obstetric Comments  2000-failure to progress 2004 twins-female and female          Home Medications    Prior to Admission medications   Medication Sig Start Date End Date Taking? Authorizing Provider  albuterol (PROVENTIL HFA;VENTOLIN HFA) 108 (90 Base) MCG/ACT inhaler Inhale 2 puffs into the lungs every 6  (six) hours as needed.   Yes [provider]  chlorpheniramine-HYDROcodone (TUSSIONEX) 10-8 MG/5ML Take 5 mLs by mouth every 12 (twelve) hours as needed. 02/20/23  Yes Zymere Patlan, DO  predniSONE (STERAPRED UNI-PAK 21 TAB) 10 MG (21) TBPK tablet Take by mouth daily. Take 6 tabs by mouth daily for 1, then 5 tabs for 1 Whitney Velasquez, then 4 tabs for 1 Whitney Velasquez, then 3 tabs for 1 Whitney Velasquez, then 2 tabs for 1 Whitney Velasquez, then 1 tab for 1 Whitney Velasquez. 02/21/23  Yes Cassity Christian, DO  Segesterone-Ethinyl Estradiol (ANNOVERA) 0.15-0.013 MG/24HR RING INSERT 1 RING IN THE VAGINA ONCE. REMOVE EACH MONTH FOR 1 WEEK FOR HORMONE FREE PERIOD 08/01/22  Yes [provider]  albuterol (ACCUNEB) 1.25 MG/3ML nebulizer solution Take 3 mLs (1.25 mg total) by nebulization every 6 (six) hours as needed for wheezing. 02/20/23   Katha Cabal, DO  docusate sodium (COLACE) 100 MG capsule Take 1 capsule (100 mg total) by mouth 2 (two) times daily as needed for mild constipation. 07/19/17   Hildred Laser, MD  fluticasone-salmeterol (ADVAIR HFA) 413-24 MCG/ACT inhaler Inhale 2 puffs into the lungs 2 (two) times daily as needed. 02/20/23  Katha Cabal, DO  Lactic Ac-Citric Ac-Pot Bitart (PHEXXI) 1.8-1-0.4 % GEL Apply 1 applicator vaginally immediately before or up to 1 hour before each episode of vaginal intercourse. 12/03/21   Hildred Laser, MD  phentermine 37.5 MG capsule Take 37.5 mg by mouth every morning.    [provider]  topiramate (TOPAMAX) 50 MG tablet Take 1 tablet by mouth at bedtime. 11/23/21 11/23/22  [provider]    Family History Family History  Problem Relation Age of Onset   Fibroids Mother    Diabetes Maternal Grandmother    Heart disease Maternal Grandmother        CAD   Cancer Paternal Grandfather    Rashes / Skin problems Son        eczema   Rashes / Skin problems Daughter        eczema    Social History Social History   Tobacco Use   Smoking status: Never    Passive exposure: Never    Smokeless tobacco: Never  Vaping Use   Vaping status: Never Used  Substance Use Topics   Alcohol use: Yes    Alcohol/week: 0.0 standard drinks of alcohol   Drug use: No     Allergies   Penicillin g   Review of Systems Review of Systems: negative unless otherwise stated in HPI.      Physical Exam Triage Vital Signs ED Triage Vitals  Encounter Vitals Group     BP 02/20/23 0910 118/70     Systolic BP Percentile --      Diastolic BP Percentile --      Pulse Rate 02/20/23 0910 96     Resp 02/20/23 0910 (!) 24     Temp 02/20/23 0910 99.3 F (37.4 C)     Temp Source 02/20/23 0910 Oral     SpO2 02/20/23 0910 96 %     Weight --      Height --      Head Circumference --      Peak Flow --      Pain Score 02/20/23 0908 3     Pain Loc --      Pain Education --      Exclude from Growth Chart --    No data found.  Updated Vital Signs BP 118/70 (BP Location: Right Arm)   Pulse 96   Temp 99.3 F (37.4 C) (Oral)   Resp (!) 24   LMP 01/30/2023 (Approximate)   SpO2 96%   Visual Acuity Right Eye Distance:   Left Eye Distance:   Bilateral Distance:    Right Eye Near:   Left Eye Near:    Bilateral Near:     Physical Exam GEN:     alert, non-toxic appearing female in no distress    HENT:  mucus membranes moist, oropharyngeal without lesions or erythema, no tonsillar hypertrophy or exudates,  moderate erythematous edematous turbinates, clear nasal discharge, bilateral TM normal EYES:   pupils equal and reactive, no scleral injection or discharge NECK:  normal ROM, no meningismus   RESP:  no increased work of breathing, frequent cough, inspiratory and expiratory wheezing CVS:   regular rate and rhythm Skin:   warm and dry    UC Treatments / Results  Labs (all labs ordered are listed, but only abnormal results are displayed) Labs Reviewed  RESP PANEL BY RT-PCR (FLU A&B, COVID) [id number]    EKG   Radiology No results found.  Procedures Procedures (including  critical care time)  Medications Ordered in UC Medications  albuterol (PROVENTIL) (2.5 MG/3ML) 0.083% nebulizer solution 2.5 mg (2.5 mg Nebulization Given 02/20/23 0937)  dexamethasone (DECADRON) injection 10 mg (10 mg Intramuscular Given 02/20/23 0941)    Initial Impression / Assessment and Plan / UC Course  I have reviewed the triage vital signs and the nursing notes.  Pertinent labs & imaging results that were available during my care of the patient were reviewed by me and considered in my medical decision making (see chart for details).       Pt is a 42 y.o. female who has asthma presents for 2 days of respiratory symptoms. Brynnleigh is afebrile here without recent antipyretics. Satting well on room air. Overall pt is non-toxic appearing, well hydrated, without respiratory distress. Pulmonary exam is remarkable for frequent cough with inspiratory and expiratory wheezing.  Recommended nebulizer treatment and Decadron injection and she is agreeable.  Patient given nebulizer treatment x 1 and Decadron 10 mg IM.  COVID and influenza testing obtained and was negative.  History consistent with asthma exacerbation secondary to acute viral respiratory illness. Discussed symptomatic treatment.  Explained lack of efficacy of antibiotics in viral disease.  Typical duration of symptoms discussed.Refilled albuterol nebulizer, Advair diskus and prescribed prednisone.  On reassessment, she only has faint expiratory wheezing now.  She feels much improved after nebulizer treatment.  Tussionex DM for cough.  Return and ED precautions given and voiced understanding. Discussed MDM, treatment plan and plan for follow-up with patient who agrees with plan.     Final Clinical Impressions(s) / UC Diagnoses   Final diagnoses:  Moderate persistent asthma with exacerbation  Viral URI with cough     Discharge Instructions      Your COVID and influenza test are negative.  I suspect your asthma was flared up  by an acute upper respiratory infection.  Stop by the pharmacy to pick up your prescriptions.  Use your albuterol nebulizer every 6 hours as needed for the next 48 hours and right before bedtime.     ED Prescriptions     Medication Sig Dispense Auth. Provider   albuterol (ACCUNEB) 1.25 MG/3ML nebulizer solution Take 3 mLs (1.25 mg total) by nebulization every 6 (six) hours as needed for wheezing. 75 mL Wilma Michaelson, DO   predniSONE (STERAPRED UNI-PAK 21 TAB) 10 MG (21) TBPK tablet Take by mouth daily. Take 6 tabs by mouth daily for 1, then 5 tabs for 1 Plante, then 4 tabs for 1 Cissell, then 3 tabs for 1 Hausner, then 2 tabs for 1 Redfield, then 1 tab for 1 Speegle. 21 tablet Tanor Glaspy, DO   fluticasone-salmeterol (ADVAIR HFA) 115-21 MCG/ACT inhaler Inhale 2 puffs into the lungs 2 (two) times daily as needed. 1 each Bernette Seeman, DO   chlorpheniramine-HYDROcodone (TUSSIONEX) 10-8 MG/5ML Take 5 mLs by mouth every 12 (twelve) hours as needed. 115 mL Shannan Garfinkel, Seward Meth, DO      I have reviewed the PDMP during this encounter.       Katha Cabal, DO 02/20/23 1018

## 2023-06-07 ENCOUNTER — Other Ambulatory Visit: Payer: Self-pay | Admitting: Internal Medicine

## 2023-06-07 DIAGNOSIS — Z1231 Encounter for screening mammogram for malignant neoplasm of breast: Secondary | ICD-10-CM

## 2023-06-13 ENCOUNTER — Ambulatory Visit: Payer: Self-pay | Admitting: Obstetrics and Gynecology

## 2023-06-13 ENCOUNTER — Other Ambulatory Visit (HOSPITAL_COMMUNITY)
Admission: RE | Admit: 2023-06-13 | Discharge: 2023-06-13 | Disposition: A | Discharge status: Home / Self Care | Source: Ambulatory Visit | Attending: Obstetrics and Gynecology | Admitting: Obstetrics and Gynecology

## 2023-06-13 ENCOUNTER — Encounter: Payer: Self-pay | Admitting: Obstetrics and Gynecology

## 2023-06-13 VITALS — BP 125/82 | HR 68 | Resp 16 | Ht 63.0 in | Wt 157.0 lb

## 2023-06-13 DIAGNOSIS — N888 Other specified noninflammatory disorders of cervix uteri: Secondary | ICD-10-CM | POA: Diagnosis not present

## 2023-06-13 DIAGNOSIS — Z124 Encounter for screening for malignant neoplasm of cervix: Secondary | ICD-10-CM | POA: Diagnosis present

## 2023-06-13 DIAGNOSIS — N920 Excessive and frequent menstruation with regular cycle: Secondary | ICD-10-CM | POA: Diagnosis not present

## 2023-06-13 DIAGNOSIS — N923 Ovulation bleeding: Secondary | ICD-10-CM | POA: Diagnosis not present

## 2023-06-13 DIAGNOSIS — Z789 Other specified health status: Secondary | ICD-10-CM

## 2023-06-13 NOTE — Progress Notes (Signed)
    GYNECOLOGY PROGRESS NOTE  Subjective:    Patient ID: Whitney Velasquez, female    DOB: [birth year], 43 y.o.   MRN: [id number]  HPI  Patient is a 43 y.o. [id number] female who presents for irregular bleeding. Currently using Annovera ring for contraception. She takes the ring out daily when she showers and replaces it. Notes that this month when she removed the ring she noted that it was bloody. Notes that her cycle started several days later and was very heavy with passage of clots.  When it was time to place the ring again she did, however is still noting residual old blood each time she removes it. Denies vaginal discharge, itching or odor.   The following portions of the patient's history were reviewed and updated as appropriate: allergies, current medications, past family history, past medical history, past social history, past surgical history, and problem list.  Review of Systems Pertinent items noted in HPI and remainder of comprehensive ROS otherwise negative.   Objective:   Blood pressure 125/82, pulse 68, resp. rate 16, height 5\' 3"  (1.6 m), weight 157 lb (71.2 kg), last menstrual period 06/01/2023, currently breastfeeding. Body mass index is 27.81 kg/m. General appearance: alert and no distress Abdomen: soft, non-tender; bowel sounds normal; no masses,  no organomegaly Pelvic: external genitalia normal, rectovaginal septum normal.  Vagina without discharge.  Cervix appears slightly friable, moderate bleeding with pap smear brush.  Large nabothian cyst noted at 1 o'clock, ~ 1.5 cm. NuvaRing in place.   Uterus mobile, nontender, normal shape and size.  Adnexae non-palpable, nontender bilaterally.     Assessment:   1. Intermenstrual bleeding   2. Menorrhagia with regular cycle   3. Uses vaginal contraceptive ring   4. Nabothian cyst   5. Cervical cancer screening      Plan:   - Slightly friable cervix noted today, unclear cause. Pap smear performed today (due in July of this year  but in light of cervical findings proceeded with testing today.  - Advised on possible contact irritation as she removes the ring daily. Encouraged leaving the ring in for at least 1 week (although can be left in for 3 weeks at a time). Notes that she used to remove her NuvaRing daily without any issues.  Discussed that if intermenstrual bleeding and heavier cycles continue, may need to consider switching back to the NuvaRing as she did not have any issues with this.  - Attempted to incise and drain the large nabothian cyst with only scant amount of mucoid drainage expressed. Silver nitrate placed on biopsy site.    Teresa Fender, MD [facility]

## 2023-06-16 LAB — CYTOLOGY - PAP: Diagnosis: NEGATIVE

## 2023-06-20 ENCOUNTER — Encounter: Payer: Self-pay | Admitting: Obstetrics and Gynecology

## 2023-07-05 NOTE — Progress Notes (Signed)
 GYNECOLOGY: ANNUAL EXAM   Subjective:    PCP: Yehuda Helms, MD Whitney Velasquez is a 43 y.o. female [phone] who presents for annual wellness visit. Previous pt of Dr. Denman Fischer.   Concerns: Has questions about irregular bleeding, is currently using Annovera  as contraception since June '24 and has noticed that since switching from Nuvaring, her cycles are longer, heavier, and sometimes starts bleeding prior to ring removal, is doing 3wk/1wk schedule. Wondering if she should switch back to Nuvaring? Open to IUD if that will work for her. No previous pelvic US  outside of pregnancy.   Well Woman Visit:  GYN HISTORY:  Patient's last menstrual period was 06/01/2023.     Menstrual History: OB History     Gravida  5   Para  3   Term  3   Preterm      AB  2   Living  4      SAB  1   IAB      Ectopic      Multiple  1   Live Births  4        Obstetric Comments  2000-failure to progress 2004 twins-female and female         Menarche age: 69 Patient's last menstrual period was 06/01/2023. Period Cycle (Days): 30 Period Duration (Days): 5 Menstrual Flow: Heavy Menstrual Control: Maxi pad, Thin pad Menstrual Control Change Freq (Hours): 1 Dysmenorrhea: (!) Moderate Dysmenorrhea Symptoms: Cramping   Urinary incontinence? no  Sexually active: yes Number of sexual partners: 1 Gender of sexual Partners: male Social History   Substance and Sexual Activity  Sexual Activity Yes   Partners: Male   Comment: nuvaring   Contraceptive methods: ring Dyspareunia? Sometimes  STI history: no STI/HIV testing or immunizations needed? No.   Health Maintenance: -Last pap: approximate date 06/13/23 and was normal  --> Any abnormals: yes -Last mammogram: next Monday  --> Any abnormals? Yes  -Last colon cancer screen: n/a / Type: n/a -Last DEXA scan: n/a [facility] of Breast / Colon / Cervical cancer: no -Vaccines:  Immunization History  Administered Date(s) Administered    PFIZER(Purple Top)SARS-COV-2 Vaccination 05/06/2019, 05/28/2019   Tdap 02/17/2016   Last Tdap: utd / Flu: utd / COVID: utd / Gardasil: unsure  / Shingles (50+): no / PCV20: no -Hep C screen: pc -Last lipid / glucose screening: pcp  > Exercise: , moderately active > Dietary Supplements: Folate: No;  Calcium: No}; Vitamin D: No > Body mass index is 28.72 kg/m.  > Recent dental visit No. > Seat Belt Use: Yes.   > Texting and driving? Yes.   > Guns in the house Yes.   > Recreational or other drug use: denied.   Social History   Tobacco Use   Smoking status: Never    Passive exposure: Never   Smokeless tobacco: Never  Substance Use Topics   Alcohol use: Yes    Alcohol/week: 0.0 standard drinks of alcohol   Occupation: school     Lives with: husband   PHQ-2 Score: In last two weeks, how often have you felt: Little interest or pleasure in doing things: Not at all (0) Feeling down, depressed or hopeless: Not at all (0) Score: 0  GAD-2 Over the last 2 weeks, how often have you been bothered by the following problems? Feeling nervous, anxious or on edge: Several days (+1) Not being able to stop or control worrying: Not at all (0)} Score: 0 _________________________________________________________  Current Outpatient Medications  Medication Sig Dispense Refill   albuterol  (ACCUNEB ) 1.25 MG/3ML nebulizer solution Take 3 mLs (1.25 mg total) by nebulization every 6 (six) hours as needed for wheezing. 75 mL 0   albuterol  (PROVENTIL  HFA;VENTOLIN  HFA) 108 (90 Base) MCG/ACT inhaler Inhale 2 puffs into the lungs every 6 (six) hours as needed.     docusate sodium  (COLACE) 100 MG capsule Take 1 capsule (100 mg total) by mouth 2 (two) times daily as needed for mild constipation. 60 capsule 6   fluticasone -salmeterol (ADVAIR HFA) 115-21 MCG/ACT inhaler Inhale 2 puffs into the lungs 2 (two) times daily as needed. 1 each 2   phentermine 37.5 MG capsule Take 37.5 mg by mouth every morning.      Segesterone-Ethinyl Estradiol  (ANNOVERA ) 0.15-0.013 MG/24HR RING INSERT 1 RING IN THE VAGINA ONCE. REMOVE EACH MONTH FOR 1 WEEK FOR HORMONE FREE PERIOD     predniSONE  (STERAPRED UNI-PAK 21 TAB) 10 MG (21) TBPK tablet Take by mouth daily. Take 6 tabs by mouth daily for 1, then 5 tabs for 1 Peloso, then 4 tabs for 1 Poch, then 3 tabs for 1 Standen, then 2 tabs for 1 Granato, then 1 tab for 1 Rahn. (Patient not taking: Reported on 07/11/2023) 21 tablet 0   topiramate (TOPAMAX) 50 MG tablet Take 1 tablet by mouth at bedtime.     No current facility-administered medications for this visit.   Allergies  Allergen Reactions   Penicillin G Other (See Comments)    unknown    Past Medical History:  Diagnosis Date   Asthma    Last asthma exacerbation requiring hospitalization was in 2013, no intubations.   Vaginal Pap smear, abnormal    was scheduled for colpo but found out she was pregnant   Past Surgical History:  Procedure Laterality Date   CESAREAN SECTION     CESAREAN SECTION N/A 04/29/2016   Procedure: CESAREAN SECTION;  Surgeon: Teresa Fender, MD;  Location: [facility] ORS;  Service: Obstetrics;  Laterality: N/A;    Review Of Systems  Constitutional: Denied constitutional symptoms, night sweats, recent illness, fatigue, fever, insomnia and weight loss.  Eyes: Denied eye symptoms, eye pain, photophobia, vision change and visual disturbance.  Ears/Nose/Throat/Neck: Denied ear, nose, throat or neck symptoms, hearing loss, nasal discharge, sinus congestion and sore throat.  Cardiovascular: Denied cardiovascular symptoms, arrhythmia, chest pain/pressure, edema, exercise intolerance, orthopnea and palpitations.  Respiratory: Denied pulmonary symptoms, asthma, pleuritic pain, productive sputum, cough, dyspnea and wheezing.  Gastrointestinal: Denied, gastro-esophageal reflux, melena, nausea and vomiting.  Genitourinary: Heavier periods with clots  Musculoskeletal: Denied musculoskeletal symptoms, stiffness, swelling,  muscle weakness and myalgia.  Dermatologic: Denied dermatology symptoms, rash and scar.  Neurologic: Denied neurology symptoms, dizziness, headache, neck pain and syncope.  Psychiatric: Denied psychiatric symptoms, anxiety and depression.  Endocrine: Denied endocrine symptoms including hot flashes and night sweats.      Objective:    BP 118/66   Pulse 80   Ht 5\' 2"  (1.575 m)   Wt 157 lb (71.2 kg)   LMP 06/01/2023   BMI 28.72 kg/m   Constitutional: Well-developed, well-nourished female in no acute distress Neurological: Alert and oriented to person, place, and time Psychiatric: Mood and affect appropriate Skin: No rashes or lesions Neck: Supple without masses. Trachea is midline.Thyroid is normal size without masses Lymphatics: No cervical, axillary, supraclavicular, or inguinal adenopathy noted Respiratory: Clear to auscultation bilaterally. Good air movement with normal work of breathing. Cardiovascular: Regular rate and rhythm. Extremities grossly normal, nontender with no edema; pulses regular  Gastrointestinal: Soft, nontender, nondistended. No masses or hernias appreciated. No hepatosplenomegaly. No fluid wave. No rebound or guarding. Breast Exam: normal appearance, no masses or tenderness, Inspection negative, No nipple retraction or dimpling, No nipple discharge or bleeding, No axillary or supraclavicular adenopathy, Normal to palpation without dominant masses Genitourinary:         External Genitalia: Normal female genitalia    Vagina: Normal mucosa, no lesions.    Cervix: Enlarged nabothian cyst at 1:00, normal size and consistency; no cervical motion tenderness; non-friable; Pap not obtained.    Uterus: Normal size and contour; smooth, mobile, NT, anteverted. Adnexae: Non-palpable and non-tender Perineum/Anus: No lesions Rectal: deferred    Assessment/Plan:    Whitney Velasquez is a 43 y.o. female [phone] with normal well-woman gynecologic exam.  -Screenings:  Pap: due  05/2026 Mammogram: ordered by PCP for this week Labs: PCP GAD/PHQ-2 = 0 -Contraception: Annovera  ring; continue for now -Vaccines: UTD -Healthy lifestyle modifications discussed: multivitamin, diet, exercise, sunscreen, tobacco and alcohol use. Emphasized importance of regular physical activity.  -Folate, Calcium and Vit D recommendation reviewed.  -All questions answered to patient's satisfaction.  -RTC 1 yr for annual, sooner prn.   Nabothian cyst: at 1:00 of cervix, is enlarged and could account for pt's discomfort during intercourse. Discussed removal in same-[place] surgery if desires, but is benign. Pt will consider and let us  know if desires removal.   AUB: -Could be from Annovera  after switch from Nuvaring, vs other pathology. Will obtain pelvic US  and have follow up in 2-3 wks to review results and discuss tx options.    [name], DO [facility]

## 2023-07-05 NOTE — Patient Instructions (Signed)
 Preventive Care 16-44 Years Old, Female  Preventive care refers to lifestyle choices and visits with your health care provider that can promote health and wellness. Preventive care visits are also called wellness exams.  What can I expect for my preventive care visit?  Counseling  Your health care provider may ask you questions about your:  Medical history, including:  Past medical problems.  Family medical history.  Pregnancy history.  Current health, including:  Menstrual cycle.  Method of birth control.  Emotional well-being.  Home life and relationship well-being.  Sexual activity and sexual health.  Lifestyle, including:  Alcohol, nicotine or tobacco, and drug use.  Access to firearms.  Diet, exercise, and sleep habits.  Work and work Astronomer.  Sunscreen use.  Safety issues such as seatbelt and bike helmet use.  Physical exam  Your health care provider will check your:  Height and weight. These may be used to calculate your BMI (body mass index). BMI is a measurement that tells if you are at a healthy weight.  Waist circumference. This measures the distance around your waistline. This measurement also tells if you are at a healthy weight and may help predict your risk of certain diseases, such as type 2 diabetes and high blood pressure.  Heart rate and blood pressure.  Body temperature.  Skin for abnormal spots.  What immunizations do I need?    Vaccines are usually given at various ages, according to a schedule. Your health care provider will recommend vaccines for you based on your age, medical history, and lifestyle or other factors, such as travel or where you work.  What tests do I need?  Screening  Your health care provider may recommend screening tests for certain conditions. This may include:  Lipid and cholesterol levels.  Diabetes screening. This is done by checking your blood sugar (glucose) after you have not eaten for a while (fasting).  Pelvic exam and Pap test.  Hepatitis B test.  Hepatitis C  test.  HIV (human immunodeficiency virus) test.  STI (sexually transmitted infection) testing, if you are at risk.  Lung cancer screening.  Colorectal cancer screening.  Mammogram. Talk with your health care provider about when you should start having regular mammograms. This may depend on whether you have a family history of breast cancer.  BRCA-related cancer screening. This may be done if you have a family history of breast, ovarian, tubal, or peritoneal cancers.  Bone density scan. This is done to screen for osteoporosis.  Talk with your health care provider about your test results, treatment options, and if necessary, the need for more tests.  Follow these instructions at home:  Eating and drinking    Eat a diet that includes fresh fruits and vegetables, whole grains, lean protein, and low-fat dairy products.  Take vitamin and mineral supplements as recommended by your health care provider.  Do not drink alcohol if:  Your health care provider tells you not to drink.  You are pregnant, may be pregnant, or are planning to become pregnant.  If you drink alcohol:  Limit how much you have to 0-1 drink a day.  Know how much alcohol is in your drink. In the [place] one drink equals one 12 oz bottle of beer (355 mL), one 5 oz glass of wine (148 mL), or one 1 oz glass of hard liquor (44 mL).  Lifestyle  Brush your teeth every morning and night with fluoride toothpaste. Floss one time each day.  Exercise for at least  30 minutes 5 or more days each week.  Do not use any products that contain nicotine or tobacco. These products include cigarettes, chewing tobacco, and vaping devices, such as e-cigarettes. If you need help quitting, ask your health care provider.  Do not use drugs.  If you are sexually active, practice safe sex. Use a condom or other form of protection to prevent STIs.  If you do not wish to become pregnant, use a form of birth control. If you plan to become pregnant, see your health care provider for a  prepregnancy visit.  Take aspirin only as told by your health care provider. Make sure that you understand how much to take and what form to take. Work with your health care provider to find out whether it is safe and beneficial for you to take aspirin daily.  Find healthy ways to manage stress, such as:  Meditation, yoga, or listening to music.  Journaling.  Talking to a trusted person.  Spending time with friends and family.  Minimize exposure to UV radiation to reduce your risk of skin cancer.  Safety  Always wear your seat belt while driving or riding in a vehicle.  Do not drive:  If you have been drinking alcohol. Do not ride with someone who has been drinking.  When you are tired or distracted.  While texting.  If you have been using any mind-altering substances or drugs.  Wear a helmet and other protective equipment during sports activities.  If you have firearms in your house, make sure you follow all gun safety procedures.  Seek help if you have been physically or sexually abused.  What's next?  Visit your health care provider once a year for an annual wellness visit.  Ask your health care provider how often you should have your eyes and teeth checked.  Stay up to date on all vaccines.  This information is not intended to replace advice given to you by your health care provider. Make sure you discuss any questions you have with your health care provider.  Document Revised: 08/12/2020 Document Reviewed: 08/12/2020  Elsevier Patient Education  2024 [employer].

## 2023-07-11 ENCOUNTER — Encounter: Payer: Self-pay | Admitting: Obstetrics

## 2023-07-11 ENCOUNTER — Ambulatory Visit (INDEPENDENT_AMBULATORY_CARE_PROVIDER_SITE_OTHER): Admitting: Obstetrics

## 2023-07-11 VITALS — BP 118/66 | HR 80 | Ht 62.0 in | Wt 157.0 lb

## 2023-07-11 DIAGNOSIS — N939 Abnormal uterine and vaginal bleeding, unspecified: Secondary | ICD-10-CM

## 2023-07-11 DIAGNOSIS — Z01419 Encounter for gynecological examination (general) (routine) without abnormal findings: Secondary | ICD-10-CM | POA: Diagnosis not present

## 2023-07-11 DIAGNOSIS — N888 Other specified noninflammatory disorders of cervix uteri: Secondary | ICD-10-CM

## 2023-07-11 DIAGNOSIS — Z789 Other specified health status: Secondary | ICD-10-CM

## 2023-07-14 ENCOUNTER — Ambulatory Visit
Admission: RE | Admit: 2023-07-14 | Discharge: 2023-07-14 | Disposition: A | Discharge status: Home / Self Care | Source: Ambulatory Visit | Attending: Obstetrics | Admitting: Obstetrics

## 2023-07-14 DIAGNOSIS — N939 Abnormal uterine and vaginal bleeding, unspecified: Secondary | ICD-10-CM | POA: Diagnosis present

## 2023-07-17 ENCOUNTER — Ambulatory Visit
Admission: RE | Admit: 2023-07-17 | Discharge: 2023-07-17 | Disposition: A | Discharge status: Home / Self Care | Source: Ambulatory Visit | Attending: Internal Medicine | Admitting: Internal Medicine

## 2023-07-17 DIAGNOSIS — Z1231 Encounter for screening mammogram for malignant neoplasm of breast: Secondary | ICD-10-CM | POA: Diagnosis present

## 2023-07-25 ENCOUNTER — Encounter: Payer: Self-pay | Admitting: Obstetrics

## 2023-07-25 ENCOUNTER — Ambulatory Visit: Admitting: Obstetrics

## 2023-07-25 VITALS — BP 123/62 | HR 78 | Ht 63.0 in | Wt 159.0 lb

## 2023-07-25 DIAGNOSIS — N923 Ovulation bleeding: Secondary | ICD-10-CM | POA: Diagnosis not present

## 2023-07-25 DIAGNOSIS — T887XXA Unspecified adverse effect of drug or medicament, initial encounter: Secondary | ICD-10-CM

## 2023-07-25 DIAGNOSIS — Z3009 Encounter for other general counseling and advice on contraception: Secondary | ICD-10-CM

## 2023-07-25 DIAGNOSIS — N920 Excessive and frequent menstruation with regular cycle: Secondary | ICD-10-CM

## 2023-07-25 DIAGNOSIS — Z308 Encounter for other contraceptive management: Secondary | ICD-10-CM

## 2023-07-25 MED ORDER — ETONOGESTREL-ETHINYL ESTRADIOL 0.12-0.015 MG/24HR VA RING: VAGINAL_RING | VAGINAL | 4 refills | Status: AC

## 2023-07-25 NOTE — Progress Notes (Signed)
    GYNECOLOGY PROGRESS NOTE  Subjective:  PCP: Yehuda Helms, MD  Patient ID: Whitney Velasquez, female    DOB: 04-12-1980, 43 y.o.   MRN: [id number]  HPI  Patient is a 42 y.o. [id number] female who presents for u/s follow up from 07/16/23 for AUB.   The following portions of the patient's history were reviewed and updated as appropriate: allergies, current medications, past family history, past medical history, past social history, past surgical history, and problem list.  Review of Systems Pertinent items are noted in HPI.   Objective:   Blood pressure 123/62, pulse 78, height 5\' 3"  (1.6 m), weight 159 lb (72.1 kg), last menstrual period 06/22/2023, currently breastfeeding. Body mass index is 28.17 kg/m.  General appearance: alert and cooperative Abdomen: soft, non-tender; bowel sounds normal; no masses,  no organomegaly Pelvic: deferred Extremities: extremities normal, atraumatic, no cyanosis or edema Neurologic: Grossly normal  IMPRESSION: 1. 2.2 cm left ovarian cyst. No followup imaging recommended. Note: This recommendation does not apply to premenarchal patients or to those with increased risk (genetic, family history, elevated tumor markers or other high-risk factors) of ovarian cancer. Reference: Radiology 2019 Nov; 293(2):359-371. 2. 8 mm anterior fundal uterine hypoechoic focus, nonspecific but could represent a incidental subcentimeter fibroid. 3. No other acute or significant finding by ultrasound.  Assessment/Plan:   1. Intermenstrual bleeding   2. Menorrhagia with regular cycle   3. Medication side effect   4. Encounter for other contraceptive management     43 y.o. [id number] with [facility] with intermenstrual bleeding, on Annovera  and most recent pelvic US  without clear etiology, a 8mm potential fibroid unlikely cause. Discussed most likely her Annovera , as her bleeding profile was well controlled on Nuvaring and after 9-10 mos of Annovera , began with AUB. We will try and  swap her back to Nuvaring, may be some coverage issues, but recently obtained new insurance. Give 90-Soule transition, contact office if bothersome symptoms or side effects persist after that. Otherwise, follow up 51yr for annual, sooner prn.    [name], DO [facility]

## 2024-01-22 IMAGING — MG MM BREAST LOCALIZATION CLIP
4 series · 4 of 12 positions shown · non-contrast
Comparison: Previous exam(s).

CLINICAL DATA: Patient with indeterminate left breast masses status
post biopsy.

EXAM:
3D DIAGNOSTIC LEFT MAMMOGRAM POST ULTRASOUND BIOPSY

[L CC synth-2D]
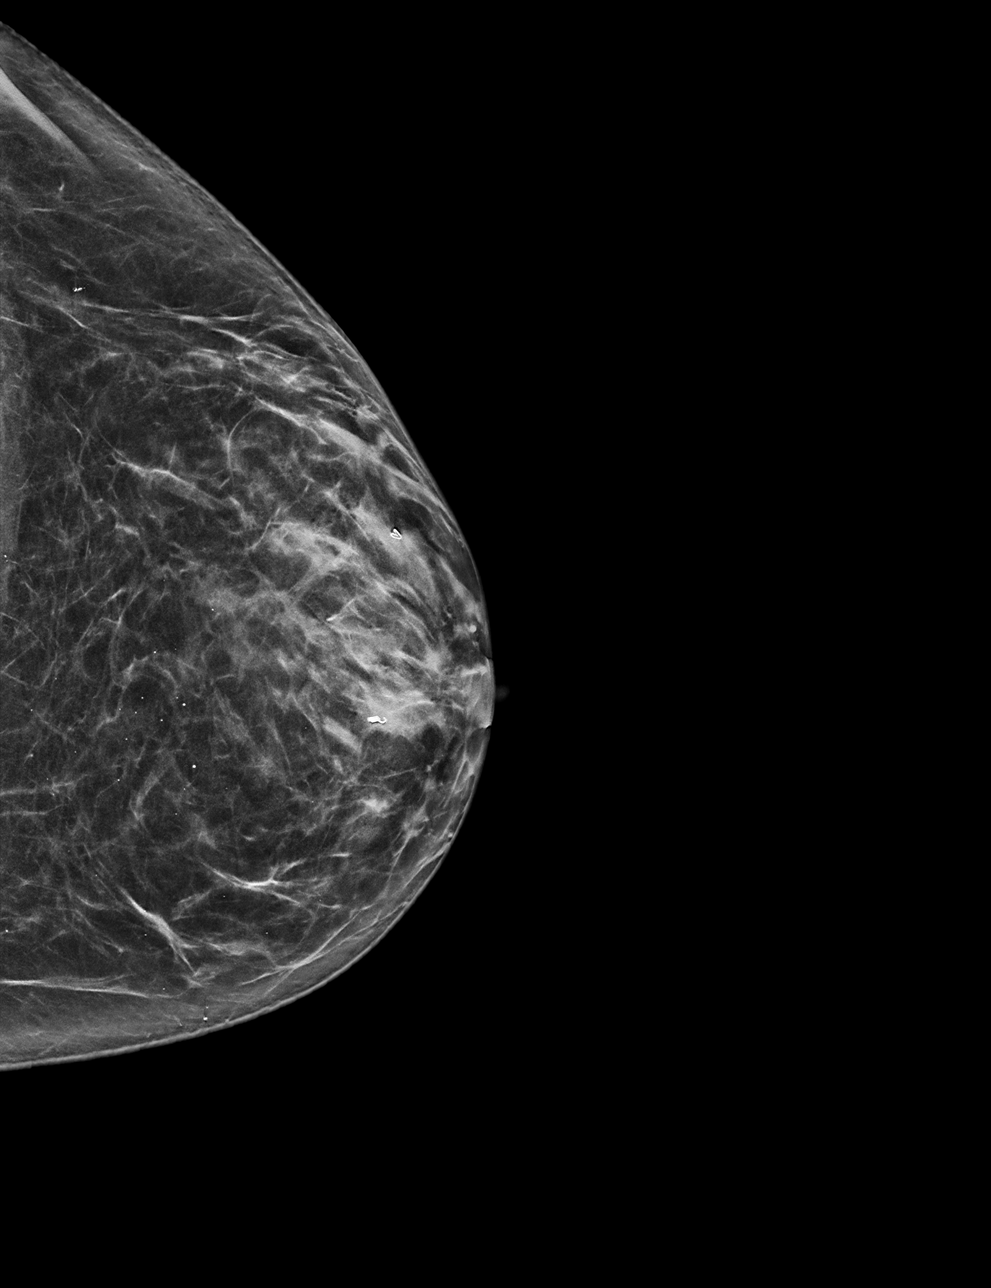

[L ML synth-2D]
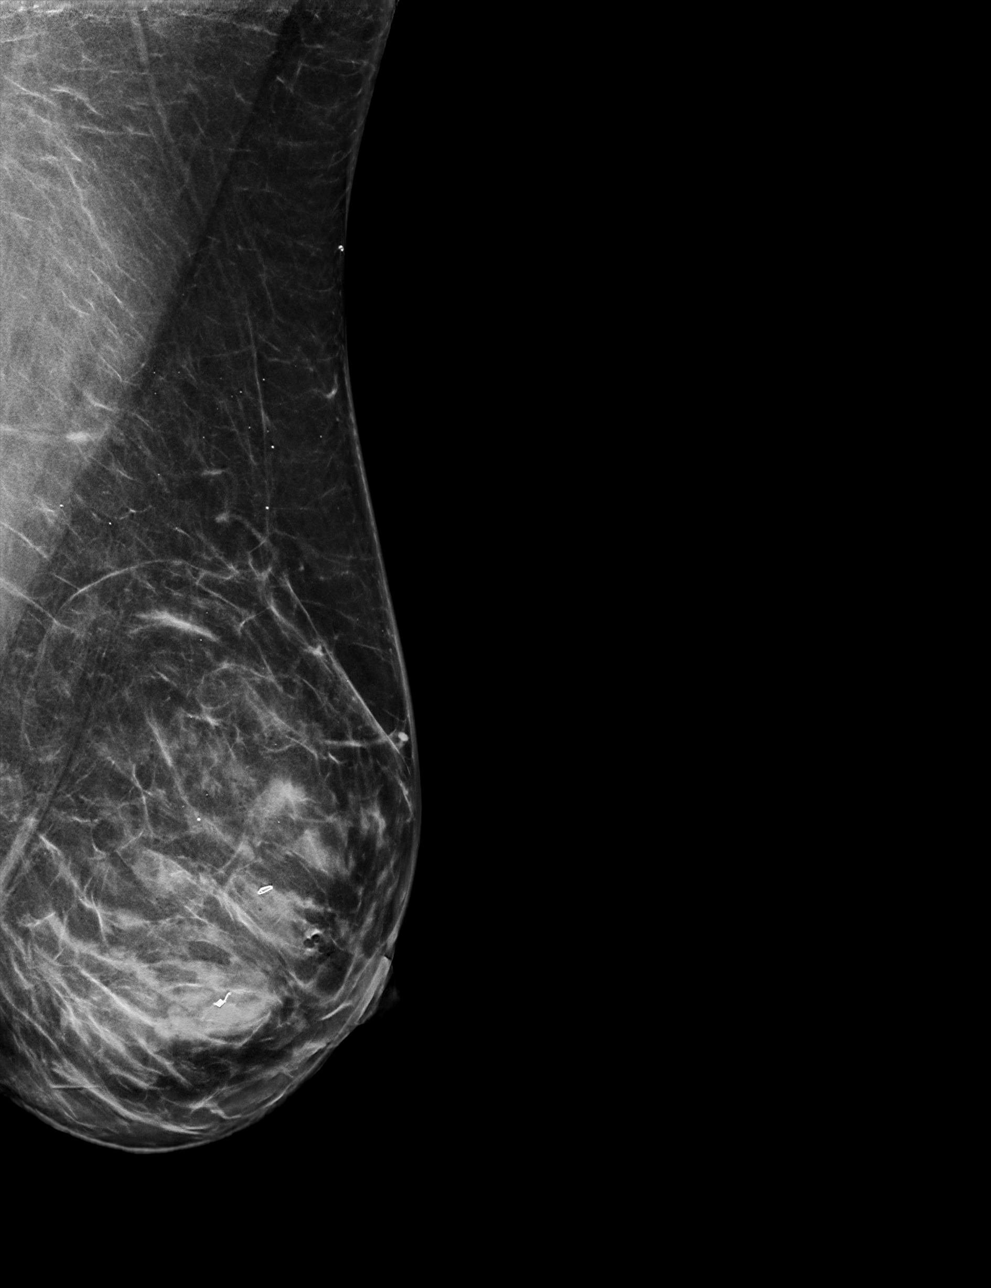

[L CC tomo · tomo slice 31/62.0]
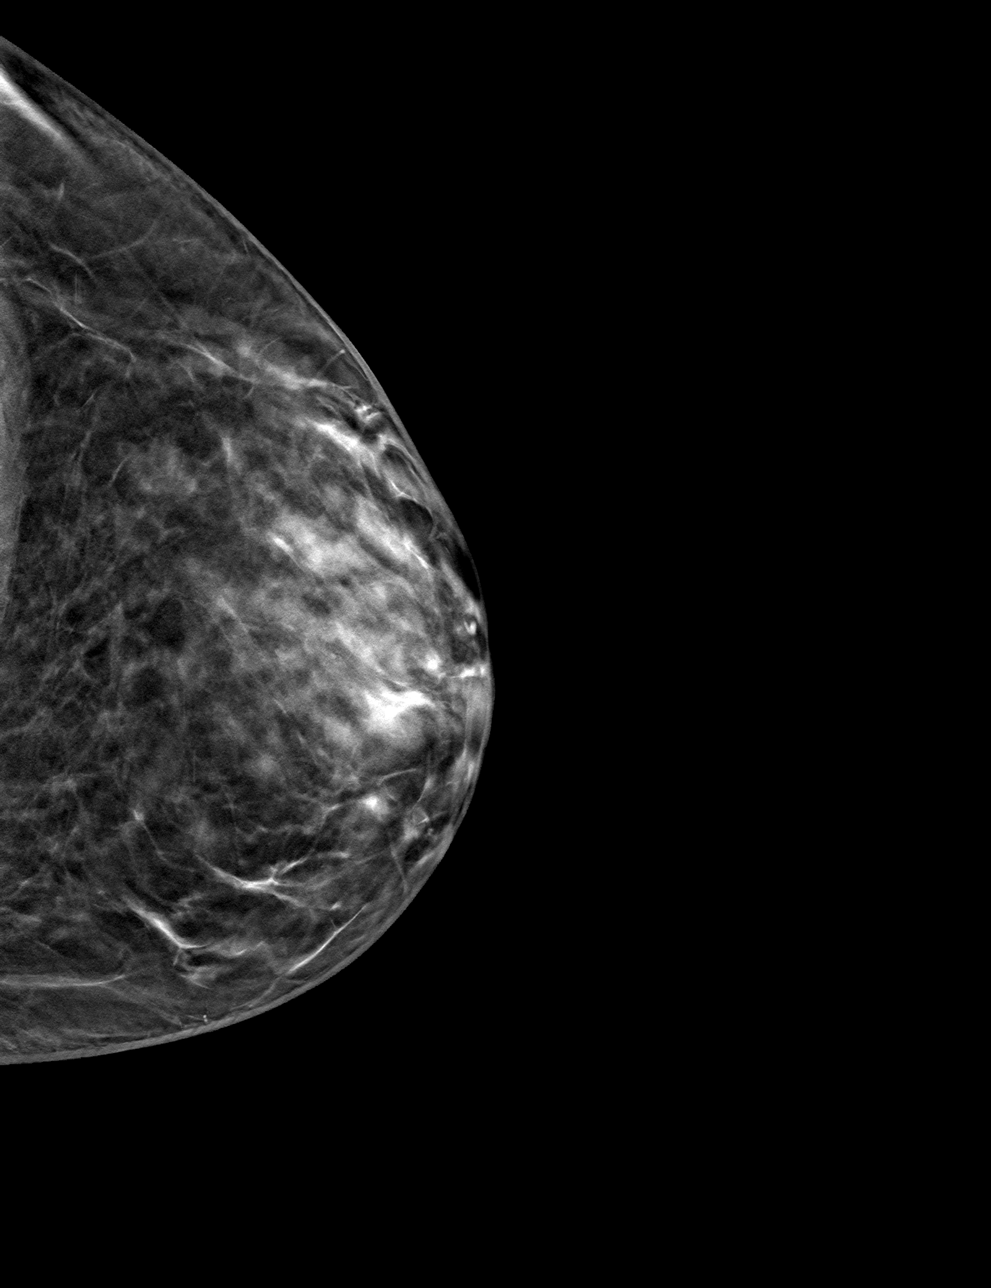

[L ML tomo · tomo slice 39/77.0]
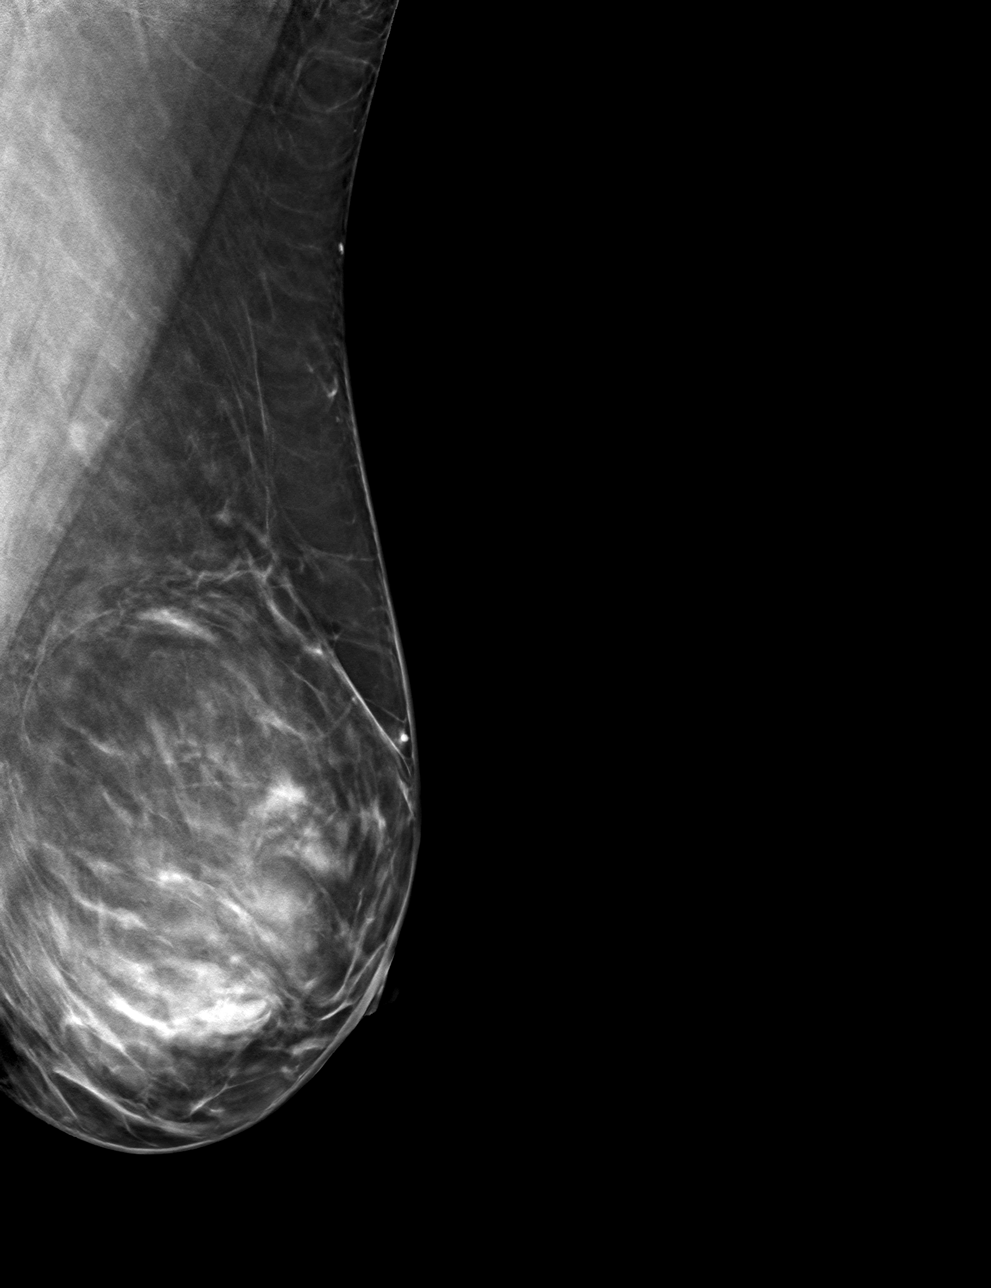

[4 of 12 positions shown; findings below may reference images not displayed]

FINDINGS: 3D Mammographic images were obtained following ultrasound guided
biopsy of left breast masses.

Site 1: Left breast mass 4 o'clock position: Heart shaped clip: In
appropriate position.

Site 2: Left breast mass 6 o'clock position: Coil shaped clip: In
appropriate position
IMPRESSION: Appropriate positioning of the biopsy marking clips as above.

Final Assessment: Post Procedure Mammograms for Marker Placement

## 2024-01-22 IMAGING — MG US BREAST BX W LOC DEV 1ST LESION IMG BX SPEC US GUIDE*L*
1 series · 8 of 8 positions shown · non-contrast
Comparison: Previous exam(s).
COMPARISON: Previous exam(s).

Addendum:
CLINICAL DATA: Patient with indeterminate masses left breast 4
o'clock and 6 o'clock position.

EXAM:
ULTRASOUND GUIDED LEFT BREAST CORE NEEDLE BIOPSY

[Series 1: MG view · 0.03mm/px · 8 of 16 slices shown]
[im 1/16]
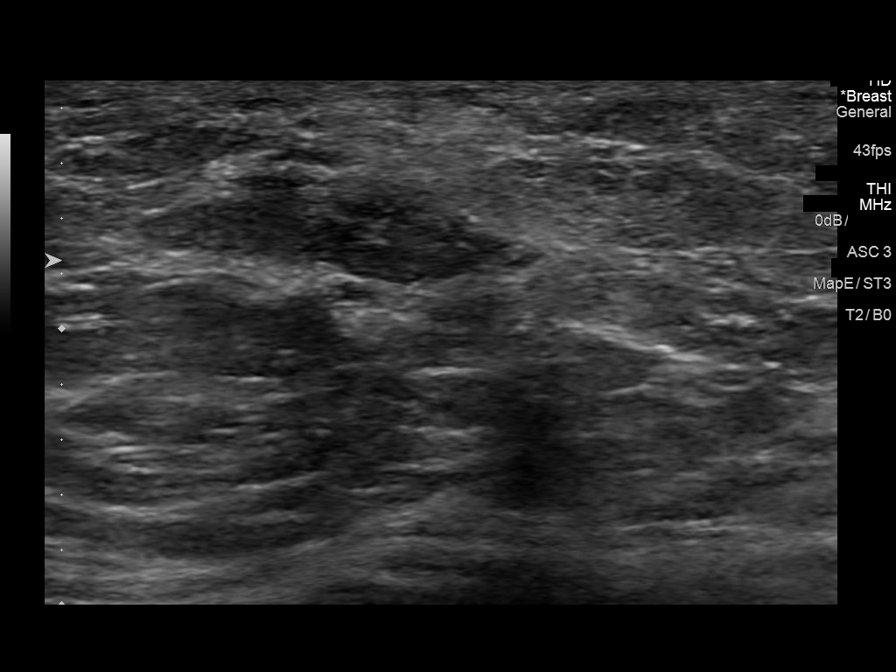
[im 3/16]
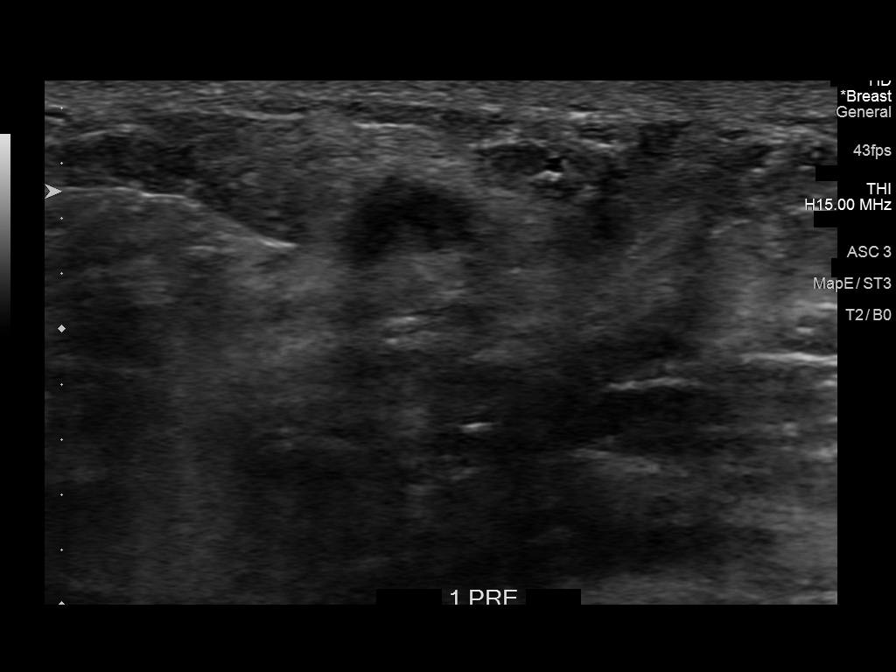
[im 5/16]
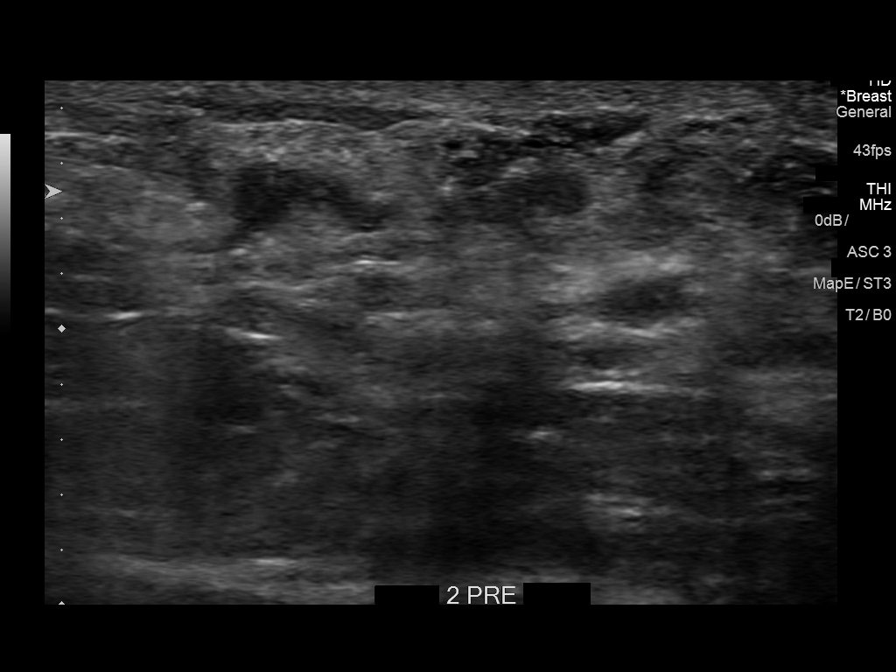
[im 7/16]
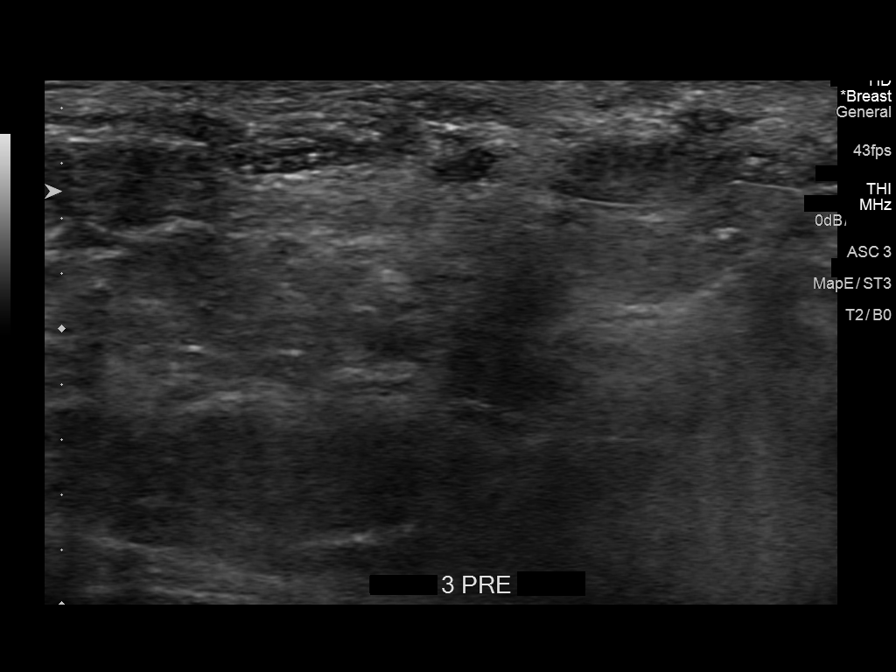
[im 9/16]
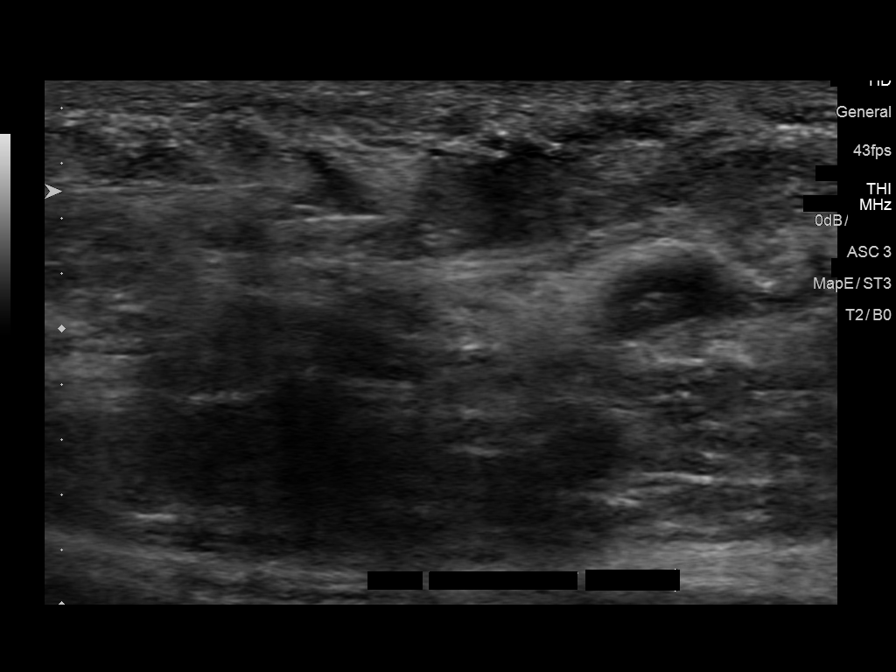
[im 11/16]
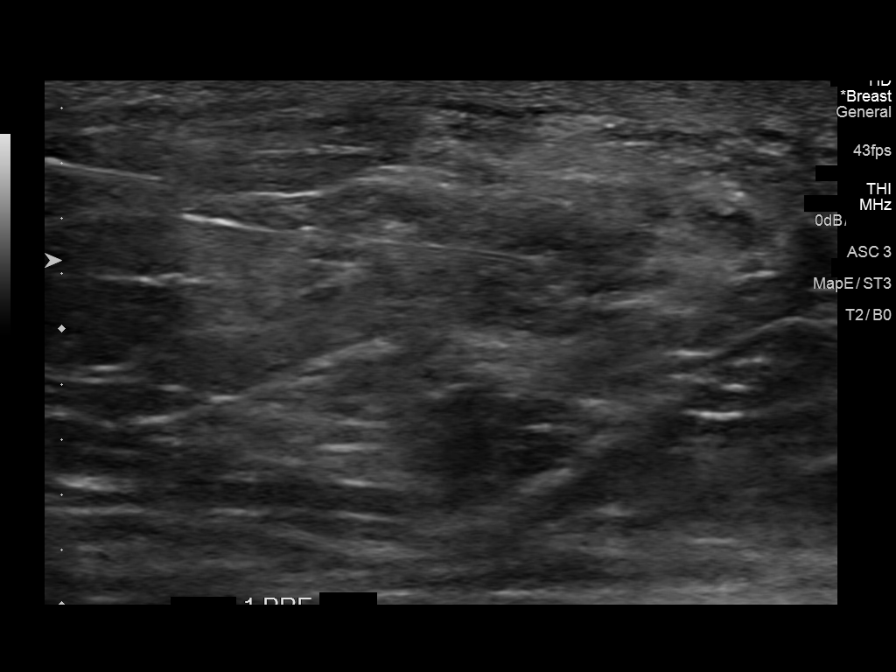
[im 13/16]
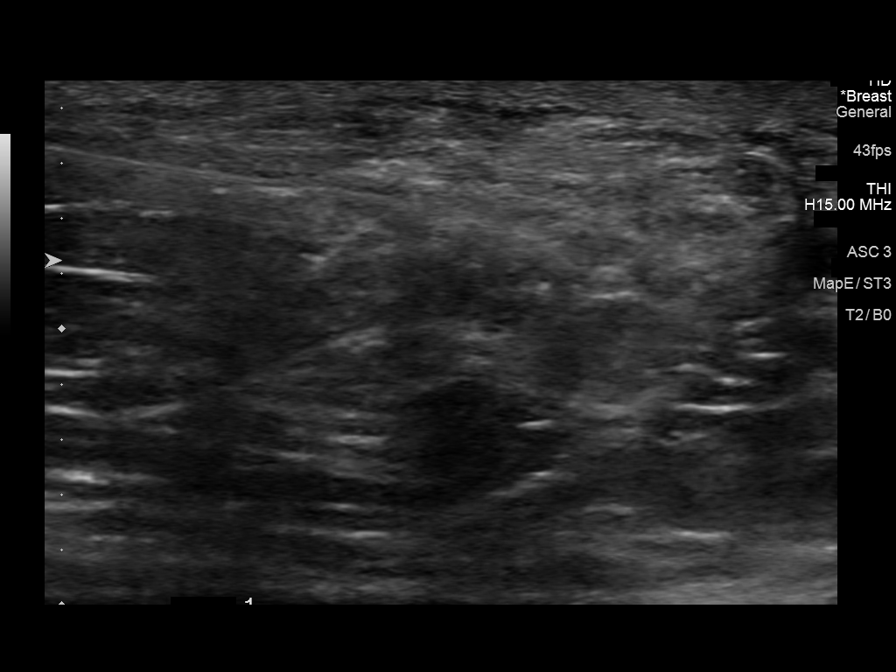
[im 16/16]
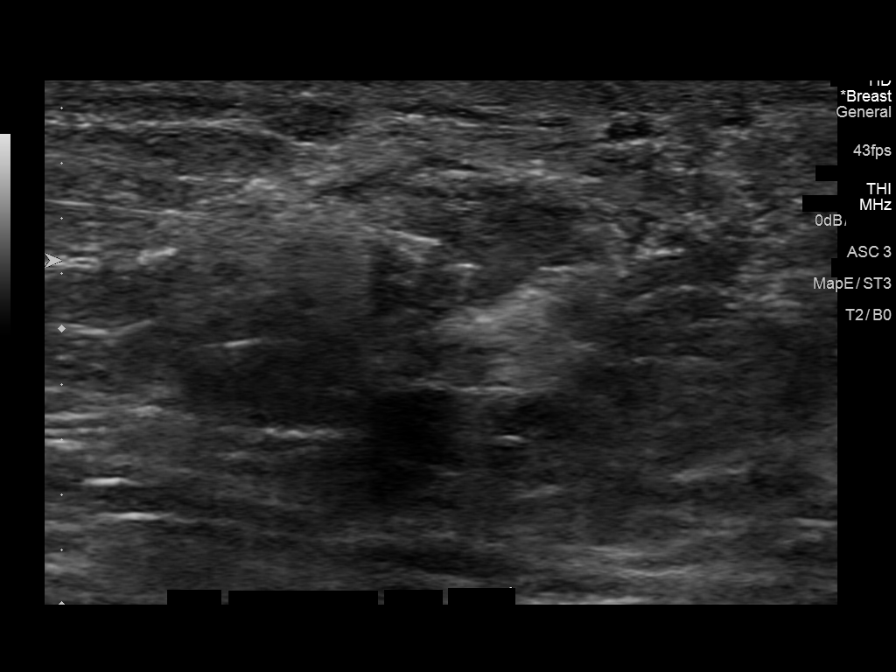

[8 of 8 positions shown; findings below may reference images not displayed]



Site 1: Left breast mass 4 o'clock position

Lesion quadrant: Lower outer quadrant

Using sterile technique and 1% Lidocaine as local anesthetic, under
direct ultrasound visualization, a 14 gauge Deangelis device was
used to perform biopsy of left breast mass 4 position using a
lateral approach. At the conclusion of the procedure heart shaped
tissue marker clip was deployed into the biopsy cavity. Follow up 2
view mammogram was performed and dictated separately.

Site 2 left breast mass 6 o'clock position

Lesion quadrant: Lower outer quadrant

Using sterile technique and 1% Lidocaine as local anesthetic, under
direct ultrasound visualization, a 14 gauge Deangelis device was
used to perform biopsy of left breast mass 6 o'clock position using
a lateral approach. At the conclusion of the procedure coil tissue
marker clip was deployed into the biopsy cavity. Follow up 2 view
mammogram was performed and dictated separately.
IMPRESSION: Ultrasound guided biopsy of left breast mass 4 o'clock and 6 o'clock
position. No apparent complications.

ADDENDUM:
PATHOLOGY revealed: Site A. BREAST, LEFT RETROAREOLAR [DATE] (HEART);
ULTRASOUND-GUIDED BIOPSY: - BENIGN BREAST TISSUE WITH
PSEUDOANGIOMATOUS STROMAL HYPERPLASIA AND COLUMNAR CELL CHANGE. -
NEGATIVE FOR ATYPIA AND MALIGNANCY.

Pathology results are CONCORDANT with imaging findings, per Dr. Jim
Tiger.

PATHOLOGY revealed: Site B. BREAST, LEFT RETROAREOLAR [DATE] (COIL);
ULTRASOUND-GUIDED BIOPSY: - BENIGN BREAST TISSUE WITH FOCAL
FIBROADENOMATOID CHANGE. - NEGATIVE FOR ATYPIA AND MALIGNANCY.

Pathology results are CONCORDANT with imaging findings, per Dr. Jim
Tiger.

Pathology results and recommendations below were discussed with
patient by telephone on 06/25/2021. Patient reported biopsy site
within normal limits with slight tenderness at the site. Post biopsy
care instructions were reviewed, questions were answered and my
direct phone number was provided to patient. Patient was instructed
to call [HOSPITAL] if any concerns or questions arise
related to the biopsy.

RECOMMENDATION: Patient instructed to continue monthly self breast
examinations and resume annual bilateral screening mammogram due
April 2022.

Pathology results reported by Edgardo Oscar Bozzini RN on 06/25/2021.



Site 1: Left breast mass 4 o'clock position

Lesion quadrant: Lower outer quadrant

Using sterile technique and 1% Lidocaine as local anesthetic, under
direct ultrasound visualization, a 14 gauge Deangelis device was
used to perform biopsy of left breast mass 4 position using a
lateral approach. At the conclusion of the procedure heart shaped
tissue marker clip was deployed into the biopsy cavity. Follow up 2
view mammogram was performed and dictated separately.

Site 2 left breast mass 6 o'clock position

Lesion quadrant: Lower outer quadrant

Using sterile technique and 1% Lidocaine as local anesthetic, under
direct ultrasound visualization, a 14 gauge Deangelis device was
used to perform biopsy of left breast mass 6 o'clock position using
a lateral approach. At the conclusion of the procedure coil tissue
marker clip was deployed into the biopsy cavity. Follow up 2 view
mammogram was performed and dictated separately.
IMPRESSION: Ultrasound guided biopsy of left breast mass 4 o'clock and 6 o'clock
position. No apparent complications.
